# Patient Record
Sex: Female | Born: 1960 | Race: White | Hispanic: No | Marital: Married | Smoking: Current every day smoker
Health system: Southern US, Community
[De-identification: ages and names within clinical notes are randomized; demographics above are authoritative.]

## PROBLEM LIST (undated history)

## (undated) DIAGNOSIS — K219 Gastro-esophageal reflux disease without esophagitis: Secondary | ICD-10-CM

## (undated) DIAGNOSIS — R0789 Other chest pain: Secondary | ICD-10-CM

## (undated) DIAGNOSIS — B159 Hepatitis A without hepatic coma: Secondary | ICD-10-CM

## (undated) DIAGNOSIS — F32A Depression, unspecified: Secondary | ICD-10-CM

## (undated) DIAGNOSIS — F329 Major depressive disorder, single episode, unspecified: Secondary | ICD-10-CM

## (undated) DIAGNOSIS — E7800 Pure hypercholesterolemia, unspecified: Secondary | ICD-10-CM

## (undated) DIAGNOSIS — T7840XA Allergy, unspecified, initial encounter: Secondary | ICD-10-CM

## (undated) DIAGNOSIS — L03019 Cellulitis of unspecified finger: Secondary | ICD-10-CM

## (undated) DIAGNOSIS — F4322 Adjustment disorder with anxiety: Secondary | ICD-10-CM

## (undated) DIAGNOSIS — N951 Menopausal and female climacteric states: Secondary | ICD-10-CM

## (undated) DIAGNOSIS — F17200 Nicotine dependence, unspecified, uncomplicated: Secondary | ICD-10-CM

## (undated) DIAGNOSIS — I809 Phlebitis and thrombophlebitis of unspecified site: Secondary | ICD-10-CM

## (undated) DIAGNOSIS — N912 Amenorrhea, unspecified: Secondary | ICD-10-CM

## (undated) HISTORY — DX: Nicotine dependence, unspecified, uncomplicated: F17.200

## (undated) HISTORY — DX: Major depressive disorder, single episode, unspecified: F32.9

## (undated) HISTORY — PX: ABDOMINAL HYSTERECTOMY: SHX81

## (undated) HISTORY — DX: Cellulitis of unspecified finger: L03.019

## (undated) HISTORY — DX: Adjustment disorder with anxiety: F43.22

## (undated) HISTORY — DX: Amenorrhea, unspecified: N91.2

## (undated) HISTORY — PX: BREAST SURGERY: SHX581

## (undated) HISTORY — DX: Menopausal and female climacteric states: N95.1

## (undated) HISTORY — PX: CERVICAL FUSION: SHX112

## (undated) HISTORY — DX: Hepatitis a without hepatic coma: B15.9

## (undated) HISTORY — DX: Pure hypercholesterolemia, unspecified: E78.00

## (undated) HISTORY — DX: Phlebitis and thrombophlebitis of unspecified site: I80.9

## (undated) HISTORY — DX: Depression, unspecified: F32.A

## (undated) HISTORY — DX: Other chest pain: R07.89

## (undated) HISTORY — DX: Allergy, unspecified, initial encounter: T78.40XA

## (undated) HISTORY — DX: Gastro-esophageal reflux disease without esophagitis: K21.9

## (undated) NOTE — *Deleted (*Deleted)
Patient Care Team: Panosh, Neta Mends, MD as PCP - Denyse Amass, Margretta Ditty, RN as Oncology Nurse Navigator Donnelly Angelica, RN as Oncology Nurse Navigator  DIAGNOSIS: No diagnosis found.  SUMMARY OF ONCOLOGIC HISTORY: Oncology History  Malignant neoplasm of upper-inner quadrant of right breast in female, estrogen receptor positive (HCC)  02/03/2020 Initial Diagnosis   Patient palpated a right beast lump for several months. Mammogram on 01/26/20 showed a 1.2cm mass in the right breast at the 1 o'clock position, a 0.4cm mass at the 12 o'clock position, and no evidence of abnormal axillary adenopathy. 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 She has a history of saline breast implants.   03/09/2020 Surgery   Right lumpectomy (Dr. Johnney Ou) : IDC 1.5 cm, grade 2, margins negative, 0/3 lymph nodes negative Second right lumpectomy: grade 2 IDC 7 mm, margins negative, ER 90%, PR 90%, HER-2 negative, Ki-67 20%   03/10/2020 Oncotype testing   Oncotype DX recurrence score 11, distant recurrence at 9 years: 3%, no chemotherapy benefit   04/23/2020 -  Radiation Therapy   Adjuvant radiation therapy     CHIEF COMPLIANT: Follow-up of right breast cancer on anastrozole  INTERVAL HISTORY: Kari Rowland is a 89 y.o. with above-mentioned history of  right breast cancer who underwent a lumpectomy, radiation, and is currently on antiestrogen therapy with half a tablet of anastrozole daily. She presents to the clinic today for follow-up.    ALLERGIES:  is allergic to procaine, sulfa antibiotics, azithromycin, erythromycin, morphine, other, rosuvastatin, sulfonamide derivatives, tetracycline, and ciprofloxacin.  MEDICATIONS:  Current Outpatient Medications  Medication Sig Dispense Refill  . anastrozole (ARIMIDEX) 1 MG tablet Take 0.5 tablets (0.5 mg total) by mouth daily. 30 tablet 0  . atorvastatin (LIPITOR) 20 MG tablet Take 20  mg by mouth daily.    . cholecalciferol (VITAMIN D) 400 UNITS TABS Take 1,000 Units by mouth daily.      . diazepam (VALIUM) 5 MG tablet 1/2 to 1 tab po tid prn vertigo (Patient not taking: Reported on 04/10/2020) 20 tablet 0  . omeprazole (PRILOSEC) 40 MG capsule Take 1 capsule (40 mg total) by mouth daily. (Patient not taking: Reported on 04/10/2020) 30 capsule 6  . silver sulfADIAZINE (SILVADENE) 1 % cream Apply 1 application topically daily. 50 g 0  . vitamin B-12 (CYANOCOBALAMIN) 1000 MCG tablet 1,000 mcg daily.     No current facility-administered medications for this visit.    PHYSICAL EXAMINATION: ECOG PERFORMANCE STATUS: {CHL ONC ECOG PS:[id number]}  There were no vitals filed for this visit. There were no vitals filed for this visit.  BREAST:*** No palpable masses or nodules in either right or left breasts. No palpable axillary supraclavicular or infraclavicular adenopathy no breast tenderness or nipple discharge. (exam performed in the presence of a chaperone)  LABORATORY DATA:  I have reviewed the data as listed CMP Latest Ref Rng & Units 12/11/2009 08/14/2008 08/30/2007  Glucose 70 - 99 mg/dL 93 83 99  BUN 6 - 23 mg/dL 7 11 17   Creatinine 0.4 - 1.2 mg/dL 0.8 0.9 0.9  Sodium 161 - 145 meq/L 144 144 139  Potassium 3.5 - 5.1 meq/L 4.8 3.7 4.1  Chloride 96 - 112 meq/L 106 108 103  CO2 19 - 32 meq/L 31 31 29   Calcium 8.4 - 10.5 mg/dL 9.6 9.4 9.1  Total Protein 6.0 - 8.3 g/dL 6.6 6.3 0.9(U)  Total Bilirubin 0.3 -  1.2 mg/dL 0.5 0.9 0.7  Alkaline Phos 39 - 117 units/L 57 47 31(L)  AST 0 - 37 units/L 15 22 14   ALT 0 - 35 units/L 14 18 13     Lab Results  Component Value Date   WBC 5.4 08/14/2008   HGB 14.4 08/14/2008   HCT 41.1 08/14/2008   MCV 94.0 08/14/2008   PLT 198 08/14/2008   NEUTROABS 3.4 08/30/2007    ASSESSMENT & PLAN:  No problem-specific Assessment & Plan notes found for this encounter.    No orders of the defined types were placed in this encounter.  The  patient has a good understanding of the overall plan. she agrees with it. she will call with any problems that may develop before the next visit here.  Total time spent: *** mins including face to face time and time spent for planning, charting and coordination of care  Serena Croissant, MD 07/10/2020  I, Kirt Boys Dorshimer, am acting as scribe for Dr. Serena Croissant.  {insert scribe attestation}

---

## 2005-08-11 LAB — HM MAMMOGRAPHY

## 2006-05-11 LAB — CONVERTED CEMR LAB: Pap Smear: NORMAL

## 2007-08-20 ENCOUNTER — Ambulatory Visit: Payer: Self-pay | Admitting: Internal Medicine

## 2007-08-20 DIAGNOSIS — M25673 Stiffness of unspecified ankle, not elsewhere classified: Secondary | ICD-10-CM | POA: Insufficient documentation

## 2007-08-20 DIAGNOSIS — E049 Nontoxic goiter, unspecified: Secondary | ICD-10-CM | POA: Insufficient documentation

## 2007-08-20 DIAGNOSIS — J309 Allergic rhinitis, unspecified: Secondary | ICD-10-CM | POA: Insufficient documentation

## 2007-08-20 DIAGNOSIS — M7061 Trochanteric bursitis, right hip: Secondary | ICD-10-CM

## 2007-08-20 DIAGNOSIS — M25676 Stiffness of unspecified foot, not elsewhere classified: Secondary | ICD-10-CM

## 2007-08-20 DIAGNOSIS — M7062 Trochanteric bursitis, left hip: Secondary | ICD-10-CM

## 2007-08-20 DIAGNOSIS — F17200 Nicotine dependence, unspecified, uncomplicated: Secondary | ICD-10-CM

## 2007-08-20 HISTORY — DX: Nicotine dependence, unspecified, uncomplicated: F17.200

## 2007-08-30 ENCOUNTER — Ambulatory Visit: Payer: Self-pay | Admitting: Internal Medicine

## 2007-08-30 LAB — CONVERTED CEMR LAB
ALT: 13 units/L (ref 0–35)
BUN: 17 mg/dL (ref 6–23)
Bilirubin Urine: NEGATIVE
Bilirubin, Direct: 0.1 mg/dL (ref 0.0–0.3)
CO2: 29 meq/L (ref 19–32)
CRP, High Sensitivity: <1 — ABNORMAL LOW (ref 0.00–5.00)
Calcium: 9.1 mg/dL (ref 8.4–10.5)
Cholesterol: 184 mg/dL (ref 0–200)
Eosinophils Absolute: 0.2 10*3/uL (ref 0.0–0.6)
GFR calc Af Amer: 86 mL/min
GFR calc non Af Amer: 71 mL/min
Glucose, Bld: 99 mg/dL (ref 70–99)
Glucose, Urine, Semiquant: NEGATIVE
HDL: 41 mg/dL (ref >39.0)
Hemoglobin: 13.9 g/dL (ref 12.0–15.0)
Ketones, urine, test strip: NEGATIVE
Lymphocytes Relative: 32.6 % (ref 12.0–46.0)
MCV: 95.2 fL (ref 78.0–100.0)
Monocytes Absolute: 0.3 10*3/uL (ref 0.2–0.7)
Monocytes Relative: 4.4 % (ref 3.0–11.0)
Neutro Abs: 3.4 10*3/uL (ref 1.4–7.7)
Platelets: 240 10*3/uL (ref 150–400)
Potassium: 4.1 meq/L (ref 3.5–5.1)
Rhuematoid fact SerPl-aCnc: <20 intl units/mL — ABNORMAL LOW (ref 0.0–20.0)
Specific Gravity, Urine: 1.02
TSH: 0.72 microintl units/mL (ref 0.35–5.50)
Total Protein: 5.7 g/dL — ABNORMAL LOW (ref 6.0–8.3)
Triglycerides: 140 mg/dL (ref 0–149)
pH: 6

## 2007-09-01 LAB — CONVERTED CEMR LAB: Anti Nuclear Antibody(ANA): NEGATIVE

## 2007-09-13 ENCOUNTER — Ambulatory Visit: Payer: Self-pay | Admitting: Internal Medicine

## 2007-09-13 DIAGNOSIS — E559 Vitamin D deficiency, unspecified: Secondary | ICD-10-CM | POA: Insufficient documentation

## 2007-09-20 ENCOUNTER — Encounter: Payer: Self-pay | Admitting: Internal Medicine

## 2007-10-22 ENCOUNTER — Encounter: Payer: Self-pay | Admitting: Internal Medicine

## 2007-10-27 ENCOUNTER — Telehealth: Payer: Self-pay | Admitting: *Deleted

## 2007-11-08 ENCOUNTER — Ambulatory Visit: Payer: Self-pay | Admitting: Internal Medicine

## 2007-12-07 ENCOUNTER — Ambulatory Visit: Payer: Self-pay | Admitting: Internal Medicine

## 2007-12-07 DIAGNOSIS — J069 Acute upper respiratory infection, unspecified: Secondary | ICD-10-CM | POA: Insufficient documentation

## 2008-08-14 ENCOUNTER — Ambulatory Visit: Payer: Self-pay | Admitting: Internal Medicine

## 2008-08-14 LAB — CONVERTED CEMR LAB
ALT: 18 units/L (ref 0–35)
Albumin: 3.9 g/dL (ref 3.5–5.2)
BUN: 11 mg/dL (ref 6–23)
Bilirubin, Direct: 0.1 mg/dL (ref 0.0–0.3)
CO2: 31 meq/L (ref 19–32)
Calcium: 9.4 mg/dL (ref 8.4–10.5)
Cholesterol: 221 mg/dL (ref 0–200)
GFR calc Af Amer: 86 mL/min
Glucose, Bld: 83 mg/dL (ref 70–99)
Glucose, Urine, Semiquant: NEGATIVE
HDL: 43.3 mg/dL (ref >39.0)
Hemoglobin: 14.4 g/dL (ref 12.0–15.0)
Lymphocytes Relative: 39.6 % (ref 12.0–46.0)
MCHC: 35 g/dL (ref 30.0–36.0)
Monocytes Relative: 6.8 % (ref 3.0–12.0)
Neutrophils Relative %: 49.5 % (ref 43.0–77.0)
Platelets: 198 10*3/uL (ref 150–400)
Protein, U semiquant: NEGATIVE
RDW: 11.6 % (ref 11.5–14.6)
Sodium: 144 meq/L (ref 135–145)
TSH: 0.55 microintl units/mL (ref 0.35–5.50)
Total CHOL/HDL Ratio: 5.1
Total Protein: 6.3 g/dL (ref 6.0–8.3)
Triglycerides: 136 mg/dL (ref 0–149)
Urobilinogen, UA: 1
VLDL: 27 mg/dL (ref 0–40)
pH: 5.5

## 2008-08-21 ENCOUNTER — Ambulatory Visit: Payer: Self-pay | Admitting: Internal Medicine

## 2008-08-21 DIAGNOSIS — R3129 Other microscopic hematuria: Secondary | ICD-10-CM | POA: Insufficient documentation

## 2008-08-21 LAB — CONVERTED CEMR LAB
Nitrite: NEGATIVE
Protein, U semiquant: NEGATIVE
Specific Gravity, Urine: >=1.03
WBC Urine, dipstick: NEGATIVE

## 2008-08-22 ENCOUNTER — Encounter: Payer: Self-pay | Admitting: Internal Medicine

## 2008-08-24 ENCOUNTER — Telehealth: Payer: Self-pay | Admitting: Internal Medicine

## 2008-08-28 ENCOUNTER — Encounter: Payer: Self-pay | Admitting: Internal Medicine

## 2008-09-01 ENCOUNTER — Ambulatory Visit: Payer: Self-pay | Admitting: Internal Medicine

## 2008-09-04 LAB — CONVERTED CEMR LAB
Bacteria, UA: NEGATIVE
Bilirubin Urine: NEGATIVE
Total Protein, Urine: NEGATIVE mg/dL
Urine Glucose: NEGATIVE mg/dL
Urobilinogen, UA: 0.2 (ref 0.0–1.0)

## 2008-09-06 ENCOUNTER — Ambulatory Visit: Payer: Self-pay | Admitting: Cardiology

## 2008-09-07 ENCOUNTER — Telehealth: Payer: Self-pay | Admitting: Internal Medicine

## 2008-09-25 ENCOUNTER — Ambulatory Visit: Payer: Self-pay

## 2008-09-25 ENCOUNTER — Encounter: Payer: Self-pay | Admitting: Cardiology

## 2008-10-11 ENCOUNTER — Encounter: Payer: Self-pay | Admitting: Internal Medicine

## 2008-12-05 ENCOUNTER — Telehealth: Payer: Self-pay | Admitting: *Deleted

## 2009-12-06 ENCOUNTER — Telehealth: Payer: Self-pay | Admitting: *Deleted

## 2009-12-11 ENCOUNTER — Ambulatory Visit: Payer: Self-pay | Admitting: Internal Medicine

## 2009-12-11 DIAGNOSIS — M2550 Pain in unspecified joint: Secondary | ICD-10-CM

## 2009-12-11 DIAGNOSIS — B0089 Other herpesviral infection: Secondary | ICD-10-CM | POA: Insufficient documentation

## 2009-12-11 DIAGNOSIS — N951 Menopausal and female climacteric states: Secondary | ICD-10-CM | POA: Insufficient documentation

## 2009-12-11 HISTORY — DX: Menopausal and female climacteric states: N95.1

## 2009-12-17 ENCOUNTER — Encounter: Payer: Self-pay | Admitting: *Deleted

## 2009-12-17 LAB — CONVERTED CEMR LAB
Albumin: 4.3 g/dL (ref 3.5–5.2)
Alkaline Phosphatase: 57 units/L (ref 39–117)
BUN: 7 mg/dL (ref 6–23)
CO2: 31 meq/L (ref 19–32)
Calcium: 9.6 mg/dL (ref 8.4–10.5)
Creatinine, Ser: 0.8 mg/dL (ref 0.4–1.2)
Free T4: 1 ng/dL (ref 0.6–1.6)
Glucose, Bld: 93 mg/dL (ref 70–99)
Magnesium: 2.3 mg/dL (ref 1.5–2.5)
TSH: 0.63 microintl units/mL (ref 0.35–5.50)
Total Protein: 6.6 g/dL (ref 6.0–8.3)

## 2010-02-07 ENCOUNTER — Encounter (INDEPENDENT_AMBULATORY_CARE_PROVIDER_SITE_OTHER): Payer: Self-pay | Admitting: *Deleted

## 2010-06-25 ENCOUNTER — Telehealth: Payer: Self-pay | Admitting: Family Medicine

## 2010-09-11 NOTE — Progress Notes (Signed)
Summary: refill omeprazole 40mg   Phone Note From Pharmacy   [facility]  [address] Reason for Call: Needs renewal Details for Reason: Omeprazole 40mg  Initial call taken by: Romualdo Bolk, CMA (AAMA),  December 06, 2009 8:13 AM  Follow-up for Phone Call        She has not been seen  in office for  over a year  . She needs to schedule an OV   to be seen .  If she will run out of med  can refill x 1 only   to get her through .  Follow-up by: Madelin Headings MD,  December 06, 2009 11:51 AM  Additional Follow-up for Phone Call Additional follow up Details #1::        Pt aware and appt made. Rx sent electronically. Additional Follow-up by: Romualdo Bolk, CMA Duncan Dull),  December 06, 2009 12:41 PM    Prescriptions: OMEPRAZOLE 40 MG CPDR (OMEPRAZOLE) 1 once daily  #30 x 0   Entered by:   Romualdo Bolk, CMA (AAMA)   Authorized by:   Madelin Headings MD   Signed by:   Romualdo Bolk, CMA (AAMA) on 12/06/2009   Method used:   [facility]        [facility]  [address] [phone] ([facility]ail[facility]       [address]       [place], [place]  [address]       Ph: [phone] or [phone]       Fax: [phone]   RxID:   [id number]

## 2010-09-11 NOTE — Letter (Signed)
Summary: [facility] Referral (unable to schedule) Notification  [facility]  [address] [place], [place] [address]   Phone: [phone]  Fax: [phone]      February 07, 2010 Kari Rowland Jan 01, 1961 MRN: [id number]   [facility] [address] [place], [place]  [address]   Dear Dr. Fabian Sharp:   Thank you for your kind referral of the above patient. We have attempted to schedule the recommended Colonoscopy but have been unable to schedule because:  __ The patient was not available by phone and/or has not returned our calls.  _x_ The patient declined to schedule the procedure at this time.  We appreciate the referral and hope that we will have the opportunity to treat this patient in the future.    Sincerely,   [facility]  Vania Rea. Jarold Motto M.D. Hedwig Morton. Juanda Chance M.D. Venita Lick. Russella Dar M.D. Wilhemina Bonito. Marina Goodell M.D. Barbette Hair. Arlyce Dice M.D. Iva Boop M.D. Cheron Every.D.

## 2010-09-11 NOTE — Assessment & Plan Note (Signed)
Summary: follow up /ssc   Vital Signs:  Patient profile:   50 year old female Menstrual status:  hysterectomy Height:      65.5 inches Weight:      157 pounds BMI:     25.82 Pulse rate:   80 / minute BP sitting:   120 / 80  (right arm) Cuff size:   regular  Vitals Entered By: Romualdo Bolk, CMA (AAMA) (Dec 11, 2009 3:05 PM) CC: Follow-up visit on meds     Menstrual Status hysterectomy Last PAP Result Normal   History of Present Illness: Kari Rowland  comes in for follow up of medical conditins and medication evaluation.  Last visit was Jan 2010 Cards: neg evaluation for atypical chest pain rec modify  risk factors. Rheum   Dr Durenda Age.      said fibromyalgia.  did x ray joints that  look good.  Doesnt think she has FM.  Hip pain and   Is being helped     [employer].  Dx misalignment by  Plain x ray .       Sleep  is      Hot flushes trying otc   ?  emeren.     Much better      sleep with this.   as needed mobic for pain   with help .   GI :   flares off med with red wine.   about 50 % of the time.  taking prilosec   Valtrex   as needed for whitlow.  profession related   stable. THyroid  no recent check   Preventive Screening-Counseling & Management  Alcohol-Tobacco     Alcohol drinks/day: <1     Alcohol type: Wine     Smoking Status: current     Packs/Day: 1/2  Caffeine-Diet-Exercise     Caffeine use/day: 5-6     Does Patient Exercise: no  Current Medications (verified): 1)  Vitamin D 400 Unit  Tabs (Cholecalciferol) .... 1,000 International Units Once Daily 2)  Valtrex 1 Gm  Tabs (Valacyclovir Hcl) .Marland Kitchen.. 1 By Mouth Once Daily 3)  Amberen .... 2 By Mouth Once Daily For 90 Days Then Off For 90 Days and Repeat 4)  Omeprazole 40 Mg Cpdr (Omeprazole) .Marland Kitchen.. 1 Once Daily 5)  Mobic 15 Mg Tabs (Meloxicam) .Marland Kitchen.. 1 By Mouth Once Daily  Allergies (verified): 1)  ! Erythromycin 2)  ! Zithromax 3)  ! Sulfa 4)  ! Tetracycline 5)  ! Morphine  Past  History:  Past medical, surgical, family and social histories (including risk factors) reviewed, and no changes noted (except as noted below).  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 Jens Som Dr. Earlene Plater  Past Surgical History: Reviewed history from 08/21/2008 and no changes required. Hysterectomy-partical for fibroids Breast Augmentation 95 Cervical Fusion-Anterior 2000    Past History:  Care Management: Gynecology: [facility]:  Family History: Reviewed history from 08/21/2008 and no changes required. Family History of CAD Female 1st degree relative <50 MI in late 75's died in 17's   as well as his brothers   Mom  hypothyroidism  Sibs   scleroderma  Family  History Diabetes 1st degree relative father   Social History: Reviewed history from 08/21/2008 and no changes required. 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  Alcohol use-yes red wine intermittently Drug use-no Regular exercise-no  Household of 5 fiance and children gchild  1 pets.  Coke  sometimes sodas.  Caffeine use/day:  5-6  Review of Systems  The patient denies anorexia, fever, weight loss, weight gain, vision loss, decreased hearing, syncope, dyspnea on exertion, peripheral edema, abdominal pain, melena, hematochezia, severe indigestion/heartburn, muscle weakness, unusual weight change, abnormal bleeding, enlarged lymph nodes, and angioedema.    Physical Exam  General:  alert, well-developed, and well-nourished.   Head:  normocephalic and atraumatic.    Eyes:  vision grossly intact, pupils equal, pupils round, and pupils reactive to light.  no redness Ears:  R ear normal, L ear normal, and no external deformities.   Mouth:  Oral mucosa and oropharynx without lesions or exudates.  Teeth in good repair. Neck:  No deformities, masses, or tenderness noted. slightly enlarge thyroid no nodules  Lungs:  Normal respiratory effort, chest expands symmetrically. Lungs are clear to auscultation, no crackles or wheezes.no dullness.   Heart:  normal rate, regular rhythm, and no murmur.   Abdomen:  soft, non-tender, normal bowel sounds, no distention, no hepatomegaly, and no splenomegaly.   Msk:  no joint swelling and no joint warmth.   Pulses:  pulses intact without delay   Extremities:  no clubbing cyanosis or edema  Neurologic:  alert & oriented X3 and gait normal.   Skin:  turgor normal, color normal, no petechiae, and no purpura.   Cervical Nodes:  No lymphadenopathy noted Psych:  Oriented X3, normally interactive, good eye contact, not anxious appearing, and not depressed appearing.     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   Problem # 4:  reflux sx   disc alarm signs and   disc weaning.  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  Problem # 6:  TOBACCO USE (ICD-305.1) should stop.  Complete Medication List: 1)  Vitamin D 400 Unit Tabs (Cholecalciferol) .... 1,000 international units once daily 2)  Valtrex 1 Gm Tabs (Valacyclovir hcl) .Marland Kitchen.. 1 by mouth once daily 3)  Amberen  .... 2 by  mouth once daily for 90 days then off for 90 days and repeat 4)  Omeprazole 40 Mg Cpdr (Omeprazole) .Marland Kitchen.. 1 once daily 5)  Mobic 15 Mg Tabs (Meloxicam) .Marland Kitchen.. 1 by mouth once daily  Other Orders: Gastroenterology Referral (GI)  Patient Instructions: 1)  You will be informed of lab results when available.  2)  have pharmacy notify us about refills  3)  will notify you about colonscopy screening. 4)  Try alternating   prilosec with  zantac or pepcid  to avoid rebound acid. Prescriptions: VALTREX 1 GM  TABS (VALACYCLOVIR HCL) 1 by mouth once daily  #30 x 4   Entered and Authorized by:   Madelin Headings MD   Signed by:   Madelin Headings MD on 12/11/2009   Method used:   [facility]  [place] [address] (retail)       [address]       [place], [place]  [address]       Ph: [phone] or [phone]       Fax: [phone]   RxID:   [id number]

## 2010-09-11 NOTE — Progress Notes (Signed)
Summary: REQUEST TO SWITCH  Phone Note Call from Patient   Caller: Patient Summary of Call: This pt Kari Rowland) requests to become a pt of  Dr Abner Greenspan and transfer to the [facility] location due to the fact that they will be moving to [facility].  The [facility] location will be near their new home and will be more convenient...Marland KitchenMarland KitchenOkay to switch pt?  Please advise and I will call pt at  [phone] to advise the patient.  Thanks,  Bjorn Loser / Scheduling Initial call taken by: Debbra Riding,  June 25, 2010 4:42 PM  Follow-up for Phone Call        ok with me  Follow-up by: Madelin Headings MD,  June 25, 2010 4:50 PM  Additional Follow-up for Phone Call Additional follow up Details #1::        OK with me Additional Follow-up by: Danise Edge MD,  June 25, 2010 7:43 PM    Additional Follow-up for Phone Call Additional follow up Details #2::    LMTCB so pt can be advised and appt can be set up for est with Dr Abner Greenspan.  Follow-up by: Debbra Riding,  June 27, 2010 9:08 AM

## 2010-09-11 NOTE — Letter (Signed)
Summary: Generic Letter  [facility]  [address] [place], [place] [address]   Phone: [phone]  Fax: [phone]    12/17/2009  [facility] [address] [place], [place]  [address]  Dear Ms. Rech,  Tests: (1) BMP (METABOL)   Sodium                    144 mEq/L                   135-145   Potassium                 4.8 mEq/L                   3.5-5.1   Chloride                  106 mEq/L                   96-112   Carbon Dioxide            31 mEq/L                    19-32   Glucose                   93 mg/dL                    62-95   BUN                       7 mg/dL                     2-84   Creatinine                0.8 mg/dL                   1.3-2.4   Calcium                   9.6 mg/dL                   4.0-10.2   GFR                       80.66 mL/min                >60  Tests: (2) Hepatic/Liver Function Panel (HEPATIC)   Total Bilirubin           0.5 mg/dL                   7.2-5.3   Direct Bilirubin          0.1 mg/dL                   6.6-4.4   Alkaline Phosphatase      57 U/L                      39-117   AST                       15 U/L                      0-37   ALT  14 U/L                      0-35   Total Protein             6.6 g/dL                    1.1-9.1   Albumin                   4.3 g/dL                    4.7-8.2  Tests: (3) TSH (TSH)   FastTSH                   0.63 uIU/mL                 0.35-5.50  Tests: (4) T4, Free (FT4R)   Free T4                   1.0 ng/dL                   9.5-6.2  Tests: (5) Magnesium (MG)   Magnesium                 2.3 mg/dL                   1.3-0.8   Your labs are normal. We will follow up with a physical in 1 year or as needed.       Sincerely,   Tor Netters, CMA (AAMA)

## 2010-09-11 NOTE — Consult Note (Signed)
Summary: [facility]  [facility]   Imported By: Maryln Gottron 08/20/2009 15:45:33  _____________________________________________________________________  External Attachment:    Type:   Image     Comment:   External Document

## 2010-09-20 ENCOUNTER — Encounter: Payer: Self-pay | Admitting: Family Medicine

## 2010-09-20 ENCOUNTER — Ambulatory Visit (INDEPENDENT_AMBULATORY_CARE_PROVIDER_SITE_OTHER): Payer: BC Managed Care – PPO | Admitting: Family Medicine

## 2010-09-20 DIAGNOSIS — N912 Amenorrhea, unspecified: Secondary | ICD-10-CM

## 2010-09-20 DIAGNOSIS — E7800 Pure hypercholesterolemia, unspecified: Secondary | ICD-10-CM | POA: Insufficient documentation

## 2010-09-20 DIAGNOSIS — N951 Menopausal and female climacteric states: Secondary | ICD-10-CM

## 2010-09-20 DIAGNOSIS — F17200 Nicotine dependence, unspecified, uncomplicated: Secondary | ICD-10-CM

## 2010-09-20 HISTORY — DX: Amenorrhea, unspecified: N91.2

## 2010-09-20 HISTORY — DX: Pure hypercholesterolemia, unspecified: E78.00

## 2010-09-25 ENCOUNTER — Encounter: Payer: Self-pay | Admitting: Family Medicine

## 2010-09-26 LAB — CONVERTED CEMR LAB
Cholesterol: 262 mg/dL — ABNORMAL HIGH (ref 0–200)
HDL: 53 mg/dL (ref >39)
Triglycerides: 121 mg/dL (ref <150)
VLDL: 24 mg/dL (ref 0–40)

## 2010-10-02 NOTE — Assessment & Plan Note (Signed)
Summary: NOV est care/ hot flashes   Vital Signs:  Patient profile:   50 year old female Menstrual status:  hysterectomy Height:      65.5 inches Weight:      161 pounds BMI:     26.48 O2 Sat:      97 % on Room air Pulse rate:   70 / minute BP sitting:   119 / 77  (left arm) Cuff size:   regular  Vitals Entered By: Payton Spark CMA (September 20, 2010 2:58 PM)  O2 Flow:  Room air CC: New to est.    Primary Care Provider:  Seymour Bars DO  CC:  New to est. .  History of Present Illness: 50 yo WF presents as a NOV, a transfer from [facility].  She is a healthy WF with menopausal symptoms, c/o weight gain of 20 lbs in the past 3 yrs.  She is under more stress at home.  Her mother in law has recently moved in with them.  She is having hot flashes and night sweats that desturb her sleep.  She is s/p TAH for fibroids but has remaining ovaries.  Not interested in HRT.  Has high risk fam hx of heart dz.  She smokes 1 ppd and is overdue for labs and a mammogram.    Current Medications (verified): 1)  Vitamin D 400 Unit  Tabs (Cholecalciferol) .... 1,000 International Units Once Daily 2)  Valtrex 1 Gm  Tabs (Valacyclovir Hcl) .Marland Kitchen.. 1 By Mouth Once Daily 3)  Omeprazole 40 Mg Cpdr (Omeprazole) .Marland Kitchen.. 1 Once Daily 4)  Mobic 15 Mg Tabs (Meloxicam) .Marland Kitchen.. 1 By Mouth Once Daily  Allergies (verified): 1)  ! Erythromycin 2)  ! Zithromax 3)  ! Sulfa 4)  ! Tetracycline 5)  ! 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 Jens Som Dr. Earlene Plater  Past Surgical History: Reviewed history from 08/21/2008 and no changes  required. Hysterectomy-partical for fibroids Breast Augmentation 95 Cervical Fusion-Anterior 2000    Family History: Family History of CAD Female 1st degree relative <50 MI in late 39's died in 36's   as well as his brothers   Mom  hypothyroidism , HTN Sibs   scleroderma  Family History Diabetes 1st degree relative father   Social History: Reviewed history from 08/21/2008 and no changes required. 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  Alcohol use-yes red wine intermittently Drug use-no Regular exercise-no  Household of 5 fiance and children gchild  1 pets.  Coke  sometimes sodas.   Review of Systems       The patient complains of weight gain.  The patient denies anorexia, fever, weight loss, vision loss, decreased hearing, hoarseness, chest pain, syncope, dyspnea on exertion, peripheral edema, prolonged cough, headaches, hemoptysis, abdominal pain, melena, hematochezia, and severe indigestion/heartburn.    Physical Exam  General:  alert, well-developed, well-nourished, well-hydrated  Head:  normocephalic and atraumatic.   Mouth:  good dentition and pharynx pink and moist.   Neck:  no masses and no thyromegaly.   Lungs:  Normal respiratory effort, chest expands  symmetrically. Lungs are clear to auscultation, no crackles or wheezes. Heart:  Normal rate and regular rhythm. S1 and S2 normal without gallop, murmur, click, rub or other extra sounds. Extremities:  no E/C/C Skin:  color normal.   Psych:  good eye contact, not anxious appearing, and not depressed appearing.     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  Counseled on options for hot flashes and nightsweats including non hormonal treatments and discussed adding regular exercise, quitting  smoking, and wt loss.  She will try OTC black cohosh or Estroven first and will f/u with me for a PHYSICAL in 2-3 mos.  Problem # 2:  TOBACCO USE (ICD-305.1) Counseled on smoking cessation but pt is not ready to quit. Plan to get PFTs in the next year to screen for COPD.  Complete Medication List: 1)  Vitamin D 400 Unit Tabs (Cholecalciferol) .... 1,000 international units once daily 2)  Valtrex 1 Gm Tabs (Valacyclovir hcl) .Marland Kitchen.. 1 by mouth once daily 3)  Omeprazole 40 Mg Cpdr (Omeprazole) .Marland Kitchen.. 1 once daily 4)  Mobic 15 Mg Tabs (Meloxicam) .Marland Kitchen.. 1 by mouth once daily  Other Orders: T-Lipid Profile ([id number]) T-TSH [phone]) T-FSH [id number]) T-LH ([phone])  Patient Instructions: 1)  Omeprazole RFd. 2)  Update fasting labs one morning downstairs. 3)  Will call you w/ results. 4)  Work on Low sugar, low carb diet, eating 3 meals/ day.  Aim for 45+ min of exercise 4 days/ wk. 5)  REturn for f/u joint pain/ wt gain/ hot flashes in 4 mos. Prescriptions: OMEPRAZOLE 40 MG CPDR (OMEPRAZOLE) 1 once daily  #30 Each x 6   Entered and Authorized by:   Seymour Bars DO   Signed by:   Seymour Bars DO on 09/20/2010   Method used:   [facility]  [id number]       [address] [place], [place]  [address]       Ph: [phone] or [phone]       Fax: [phone]   RxID:   [id number]    Orders Added: 1)  T-Lipid Profile [phone] 2)  T-TSH [[id number]] 3)  T-FSH [[id number]] 4)  T-LH [83002-23680] 5)  Est. Patient Level III [37628]  Appended Document: NOV est care/ hot flashes Omeprazole 40 mg/ day was filled on 09/20/10 -- pls let pt know this.  Seymour Bars, D.O.  Appended Document: NOV est care/ hot flashes Pt aware of the above

## 2010-12-24 NOTE — Assessment & Plan Note (Signed)
[facility]                            CARDIOLOGY OFFICE NOTE   NOGA, FOGG                      MRN:          [id number]  DATE:09/06/2008                            DOB:          07/03/1961    Kari Rowland is a 50 year old female whom I am asked to evaluation for  chest pain and dyspnea.  She has no prior cardiac history.  She did  state that approximately 2 years ago, she was seen in [place] by  Dr. Gwinda Passe for chest pain.  At that time, she had an echocardiogram.  There apparently was a question of an abnormality but I do not have  those records available.  She also had a stress test.  She recently  moved to [place].  She has had some episodes of chest pain and we  were asked to further evaluate.  Her chest pain is in the substernal  area.  It occasionally radiates to her shoulder and jaw.  She describes  it as a sharp pain.  It occurs with stress only.  It is typically  approximately 10 minutes and resolves spontaneously.  Note, she does not  have exertional chest pain.  There is no associated nausea, vomiting,  shortness breath, or diaphoresis.  The pain is not pleuritic, or  positional, nor is it related to food.  She also has some dyspnea with  more extreme activities.  She attributes this to tobacco use.  There is  no orthopnea, PND, or pedal edema.  Because of the above, we were asked  to further evaluate.   MEDICATIONS:  Soy, black cohosh, and ginseng.   ALLERGIES:  SHE HAS AN ALLERGY TO ERYTHROMYCIN, TETRACYCLINE, SULFA,  ZITHROMAX, MORPHINE, EPINEPHRINE, AND NOVOCAIN.   SOCIAL HISTORY:  She does smoke.  She occasionally consumes alcohol.  She denies any drug use.  She is married.  She lost her first husband  due to carbon monoxide poisoning.  She has 5 children.   FAMILY HISTORY:  Her father had his first myocardial infarction at age  1 and died at age 37 of a myocardial infarction.  She also states that  her  mother has hypertension.   PAST MEDICAL HISTORY:  There is no diabetes mellitus, hypertension, but  there is hyperlipidemia that was recently diagnosed.  She has had a  previous goiter.  She has had a previous breast augmentation as well as  hysterectomy.  She has also had anterior cervical fusion.  She also has  a history of joint pain.  She is presently undergoing menopause.   REVIEW OF SYSTEMS:  She denies any headaches, fevers, or chills.  There  is no productive cough or hemoptysis.  There is no dysphagia,  odynophagia, melena, or hematochezia.  There is no dysuria or hematuria.  There is no rash or seizure activity.  There is no orthopnea, PND, or  pedal edema.  There is no claudication noted, although she does  occasionally has cramping in her calves.  Remaining systems are  negative.   PHYSICAL EXAMINATION:  VITAL SIGNS:  A blood pressure of 118/77 and her  pulse is 68.  She weighs 152 pounds.  GENERAL:  She is well developed and well nourished in no acute distress.  Her skin is warm and dry.  She does not appear to be depressed.  There  is no peripheral clubbing.  BACK:  Normal.  HEENT:  Normal with normal eyelids.  NECK:  Supple with normal upstroke bilaterally.  No bruits noted.  There  is no jugular venous distention and there is no thyromegaly.  CHEST:  Clear to auscultation on expansion.  CARDIOVASCULAR:  Regular rate and rhythm with normal S1 and S2.  There  are no murmurs, rubs, or gallops noted.  ABDOMEN:  Nontender.  Nondistended.  Positive bowel sounds.  No  hepatosplenomegaly.  No mass appreciated.  No abdominal bruit.  She has  2+ femoral pulses bilaterally.  No bruits.  EXTREMITIES:  Show no edema.  I can palpate no cords.  She has 2+  dorsalis pedis pulse bilaterally.  NEUROLOGIC:  Grossly intact.   Her electrocardiogram shows a sinus rhythm at a rate of 65.  The axis is  normal.  There are no ST changes noted.   DIAGNOSES:  1. Chest pain - the patient's  symptoms are atypical.  However, she is      concerned about these and has risk factors.  We will plan to      proceed with a stress echocardiogram.  If it is normal, then we      will continue risk factor modification.  2. History of abnormal echocardiogram - we will obtain records from      [facility] and we will plan to repeat her echocardiogram.  3. Tobacco abuse - we discussed the importance of discontinuing this.  4. Hyperlipidemia - I would recommend a diet at this point and she may      need a statin in the future but I will leave this to Dr. Fabian Sharp.   We will see her back on as needed basis pending the results of her above  studies.     Madolyn Frieze Jens Som, MD, [facility]  Electronically Signed    BSC/MedQ  DD: 09/06/2008  DT: 09/07/2008  Job #: [id number]   cc:   Neta Mends. Fabian Sharp, MD

## 2011-03-21 ENCOUNTER — Ambulatory Visit (INDEPENDENT_AMBULATORY_CARE_PROVIDER_SITE_OTHER): Payer: BC Managed Care – PPO | Admitting: Family Medicine

## 2011-03-21 VITALS — BP 123/73 | HR 97 | Temp 98.4°F | Ht 65.0 in | Wt 159.0 lb

## 2011-03-21 DIAGNOSIS — H8110 Benign paroxysmal vertigo, unspecified ear: Secondary | ICD-10-CM | POA: Insufficient documentation

## 2011-03-21 MED ORDER — DIAZEPAM 5 MG PO TABS: ORAL_TABLET | ORAL | Status: DC

## 2011-03-21 NOTE — Progress Notes (Signed)
  Subjective:    Patient ID: Kari Rowland, female    DOB: Jan 24, 1961, 50 y.o.   MRN: [id number]  HPI  50 yo WF presents for dizzy spells that started 2 days ago.  The room is spinning for a few seconds with moving her head quickly and laying back.  She is not having nausea or vomitting.  No HAs.  No vision problems unless the spinning.  She has to hold onto the bed to not fall.  Has felt like she is going to fall over.  No previous episodes of this.  No heart palpitations, chest pain, SOB or syncope.  No change in meds.  BP 123/73  Pulse 97  Temp 98.4 F (36.9 C)  Ht 5\' 5"  (1.651 m)  Wt 159 lb (72.122 kg)  BMI 26.46 kg/m2  SpO2 97%  Review of Systems     Objective:   Physical Exam  Constitutional: She appears well-developed and well-nourished.  HENT:  Right Ear: External ear normal.  Left Ear: External ear normal.  Mouth/Throat: Oropharynx is clear and moist.  Eyes: Conjunctivae and EOM are normal. Pupils are equal, round, and reactive to light.  Neck: Neck supple. No thyromegaly present.  Cardiovascular: Normal rate, regular rhythm and normal heart sounds.   No murmur heard. Pulmonary/Chest: Effort normal and breath sounds normal.  Lymphadenopathy:    She has no cervical adenopathy.  Neurological:       No tremor + Dix Hallpike on the L with horizontal nystagmus and dizziness x 60 seconds.    Skin: Skin is warm and dry. No pallor.  Psychiatric: She has a normal mood and affect.          Assessment & Plan:

## 2011-03-21 NOTE — Assessment & Plan Note (Signed)
New onset.  + Gilberto Better on the L only.  Given at home exercises Austin Miles h/o given to do at home 2 x a day.  Use Valium to help with symptom relief but cautioned about sedation.  No driving until dizziness is resolved.  Call if not resolved in 7 days.

## 2011-03-21 NOTE — Patient Instructions (Signed)
Benign Positional Vertigo (BPV)  Vertigo is a feeling that you are unsteady or that you or your surroundings are moving. Benign positional vertigo (BPV) is the most common form of vertigo. Benign means it does not have a serious cause. It is an upset in the balance system in your middle ear. This is troublesome but usually not serious. A viral infection or head injury are common causes, but often no cause is found. It is more common as we grow older.  SYMPTOMS  Sudden dizziness happens when you move your head in different directions. Some of the problems that come with this are:   Loss of balance    Throwing up      Blurred vision     Dizziness      Feeling sick to your stomach      DIAGNOSIS  Your caregiver may do some specialized testing to prove what is wrong.  HOME CARE INSTRUCTIONS   Rest and eat a well balanced diet.    Move slowly and do not make sudden body or head movements.    Do not drive a car or do any activities that could hurt you or others.    Lie down and rest. Take precautions to prevent falls.   SEEK IMMEDIATE MEDICAL CARE IF:   You develop headaches which are severe or lasting.    You develop continued vomiting.    A temporary loss or change of vision appears.    You notice temporary numbness on one side of your body.    You are temporarily unable to speak.    Temporary areas of weakness develop.    You have weakness or numbness in the face, arms, or legs.    You notice dizziness or difficulty walking.    You experience slurred speech or difficulty swallowing.   MAKE SURE YOU:     Understand these instructions.    Will watch your condition.    Will get help right away if you are not doing well or get worse.   Document Released: 05/05/2006 Document Re-Released: 11/13/2008  [employer] Patient Information 2011 [employer].

## 2011-04-09 ENCOUNTER — Other Ambulatory Visit: Payer: Self-pay | Admitting: Family Medicine

## 2011-04-09 DIAGNOSIS — M545 Low back pain: Secondary | ICD-10-CM

## 2011-04-11 ENCOUNTER — Ambulatory Visit
Admission: RE | Admit: 2011-04-11 | Discharge: 2011-04-11 | Disposition: A | Discharge status: Home / Self Care | Payer: BC Managed Care – PPO | Source: Ambulatory Visit | Attending: Family Medicine | Admitting: Family Medicine

## 2011-04-11 DIAGNOSIS — M545 Low back pain: Secondary | ICD-10-CM

## 2011-06-11 ENCOUNTER — Ambulatory Visit: Payer: BC Managed Care – PPO | Admitting: Family Medicine

## 2011-06-11 DIAGNOSIS — Z0289 Encounter for other administrative examinations: Secondary | ICD-10-CM

## 2011-06-12 ENCOUNTER — Encounter: Payer: Self-pay | Admitting: Family Medicine

## 2011-06-19 ENCOUNTER — Encounter: Payer: Self-pay | Admitting: Family Medicine

## 2011-06-19 ENCOUNTER — Ambulatory Visit (INDEPENDENT_AMBULATORY_CARE_PROVIDER_SITE_OTHER): Payer: BC Managed Care – PPO | Admitting: Family Medicine

## 2011-06-19 VITALS — BP 124/79 | HR 91 | Wt 164.0 lb

## 2011-06-19 DIAGNOSIS — M7060 Trochanteric bursitis, unspecified hip: Secondary | ICD-10-CM

## 2011-06-19 DIAGNOSIS — M76899 Other specified enthesopathies of unspecified lower limb, excluding foot: Secondary | ICD-10-CM

## 2011-06-19 DIAGNOSIS — M25559 Pain in unspecified hip: Secondary | ICD-10-CM

## 2011-06-19 NOTE — Progress Notes (Signed)
Subjective:    Patient ID: Kari Rowland, female    DOB: 1961/03/31, 50 y.o.   MRN: [id number]  HPI Started menopause 5 year ago.  Says back pain and hip pain that got worse around that time. Says the hip was worse initially. Saw Dr. Terrilee Croak at [facility] who thought her hip pain was form her back. Has tried PT and chiropractic care. Then in July of this year back got worse.  Was on prednisone.  Finally did an MRI and has multiple disc bulging and some tears.  Did PT again and it aggrevated her even more.  Had normal xrays of the left hip.  Both hips feel tender. The Left is worst. Hard to sleep directly on her left hip.  Sits for 8-10 hours a day and that aggrevates her hip. Dr. Chaney Malling retired and saw one of her partners.  Saw Dr. Jorge Mandril this summer who ordered this again. Does get some relief when goes to PT but by later in the say it is worse again. Has weakness and dec sensatoin in the left leg that her PT person has noticed. Using 800mg  IBU bid to tid for months.     Review of Systems     BP 124/79  Pulse 91  Wt 164 lb (74.39 kg)    Allergies  Allergen Reactions  . Azithromycin   . Erythromycin   . Morphine   . Sulfonamide Derivatives   . Tetracycline     Past Medical History  Diagnosis Date  . Hep A w/o coma   . Depression   . Allergy   . Phlebitis   . [place]   . Atypical chest pain   . GERD (gastroesophageal reflux disease)     Past Surgical History  Procedure Date  . Abdominal hysterectomy   . Breast surgery   . Cervical fusion     History   Social History  . Marital Status: Married    Spouse Name: N/A    Number of Children: N/A  . Years of Education: N/A   Occupational History  . Not on file.   Social History Main Topics  . Smoking status: Current Everyday Smoker -- 1.0 packs/day for 15 years    Types: Cigarettes  . Smokeless tobacco: Not on file  . Alcohol Use: Yes     wine intermittently  . Drug Use: No  . Sexually Active:      adjudicator, RN  [employer], single, engaged, household of 5 some caffeine.   Other Topics Concern  . Not on file   Social History Narrative  . No narrative on file    Family History  Problem Relation Age of Onset  . Heart disease      CAD  . Thyroid disease Mother   . Hypertension Mother   . Scleroderma    . Diabetes Father     Current outpatient prescriptions:omeprazole (PRILOSEC) 40 MG capsule, Take 40 mg by mouth daily.  , Disp: , Rfl: ;  valACYclovir (VALTREX) 1000 MG tablet, Take 1,000 mg by mouth daily.  , Disp: , Rfl: ;  cholecalciferol (VITAMIN D) 400 UNITS TABS, Take 1,000 Units by mouth daily.  , Disp: , Rfl: ;  diazepam (VALIUM) 5 MG tablet, 1/2 to 1 tab po tid prn vertigo, Disp: 20 tablet, Rfl: 0 meloxicam (MOBIC) 15 MG tablet, Take 15 mg by mouth daily.  , Disp: , Rfl: ;  rosuvastatin (CRESTOR) 10 MG tablet, Take 10 mg by mouth daily.  , Disp: ,  Rfl:    Objective:   Physical Exam  Constitutional: She is oriented to person, place, and time. She appears well-developed and well-nourished.  HENT:  Head: Normocephalic and atraumatic.  Musculoskeletal:       She is very tender over the left greater trochanter. No bruising or swelling or redness.  Neurological: She is alert and oriented to person, place, and time.  Skin: Skin is warm and dry.  Psychiatric: She has a normal mood and affect. Her behavior is normal.          Assessment & Plan:  Left hip pain-I explained to her that I do think that there are 2 different things going on. First I do think that she probably has some pretty significant degeneration in her back which certainly could be causing her hip pain. I know she doesn't really experience much actual back pain. Actually the pain can be radiated. I also think that she could still just have plain trochanteric bursitis as she is very tender over the greater trochanter. Her pain in her hip never radiates towards the groin crease. I would like to treat the bursitis with  an injection as she has never had this done before. I would like her to call me in about 2 weeks. If she notices some significant improvement and we will see how long the injection helps her. If she does not improve then I would like her to call me and we will refer her to a neurosurgeon for further evaluation and possible surgical treatment. I think at this point she has failed multiple rounds of physical therapy and is working with a [facility] and is followed the instructions from the orthopedist and still has not made significant improvement in her pain control. I'm also concerned because her physical therapist is noticing significant weakness in the left as well.  Left trocanteric bursitis-the area was cleaned and prepped with iodine and alcohol. 1 cc of 40 mg of Kenalog mixed with 9 cc of 1% lidocaine without epinephrine was injected into the left greater trochanter.  Patient is to ice the area when she gets home. She can call for any concerns or problems. For flares she does get relief she will notices within the next couple days.

## 2011-11-10 ENCOUNTER — Encounter: Payer: Self-pay | Admitting: Family Medicine

## 2011-11-10 ENCOUNTER — Ambulatory Visit (INDEPENDENT_AMBULATORY_CARE_PROVIDER_SITE_OTHER): Payer: BC Managed Care – PPO | Admitting: Family Medicine

## 2011-11-10 VITALS — BP 120/75 | HR 87 | Ht 65.0 in | Wt 166.0 lb

## 2011-11-10 DIAGNOSIS — J029 Acute pharyngitis, unspecified: Secondary | ICD-10-CM

## 2011-11-10 NOTE — Progress Notes (Signed)
  Subjective:    Patient ID: Kari Rowland, female    DOB: [birth year], 51 y.o.   MRN: [id number]  HPI ST x 5 days.  No fever. Painful to swallow. n o other URI sxs.  Taking tylenol for pain an salt water gargles.  . NO KNOWN EXPOSURE TO STREP   Review of Systems     Objective:   Physical Exam  Constitutional: She is oriented to person, place, and time. She appears well-developed and well-nourished.  HENT:  Head: Normocephalic and atraumatic.  Right Ear: External ear normal.  Left Ear: External ear normal.  Nose: Nose normal.  Mouth/Throat: Oropharynx is clear and moist.       TMs and canals are clear.   Eyes: Conjunctivae and EOM are normal. Pupils are equal, round, and reactive to light.  Neck: Neck supple. No thyromegaly present.       Large bilat anter cerv LN about 1.5 to 2.0 cm each. Mildly tender.   Cardiovascular: Normal rate, regular rhythm and normal heart sounds.   Pulmonary/Chest: Effort normal and breath sounds normal. She has no wheezes.  Lymphadenopathy:    She has no cervical adenopathy.  Neurological: She is alert and oriented to person, place, and time.  Skin: Skin is warm and dry.  Psychiatric: She has a normal mood and affect.          Assessment & Plan:  Pharyngitis - rapid strep is neg. Will send a culture.  Continue symptomatic care including running a humidifier continue herself on her gargles and Tylenol or Motrin for pain relief.. Call if fever or suddenly gets worse.   Trochanteric bursitis. She plans to come back in for bursa injection for her hip. Says started bothering her about a month ago and says it really helped last time.

## 2011-11-13 ENCOUNTER — Telehealth: Payer: Self-pay | Admitting: *Deleted

## 2011-11-13 MED ORDER — AMOXICILLIN 875 MG PO TABS: 875.0000 mg | ORAL_TABLET | Freq: Two times a day (BID) | ORAL | Status: AC

## 2011-11-13 NOTE — Telephone Encounter (Signed)
Ok will send a rx for amoxicillin to pharm. Thought still probably viral since she is not sure if her sxs actually started 2 days ago or a week ago.

## 2011-11-13 NOTE — Telephone Encounter (Signed)
Pt states that it started a couple days ago. States that she is snoring bad per her husband. She states it has been going on for more than 7 days even though I see she was here on the 1st.

## 2011-11-13 NOTE — Telephone Encounter (Signed)
PT states that she got the call that her strep was negative but she still haas a sore throat, severe headache and tender sinusis and yellow/green drainage. Pt wants to know if we can call something in to pharmacy or should she come back in. Please advise.

## 2011-11-13 NOTE — Telephone Encounter (Signed)
Winded the drainage start? When she was here 3 days ago she did not have any sinus pain pressure drainage or congestion. Her only symptom was a sore throat without fever. Typically a sore throat with a negative strep is viral and can last 7-10 days. Thus if she not significantly better by Monday then call the office and we will call her in something at that point in time.

## 2011-11-14 NOTE — Telephone Encounter (Signed)
LMOM

## 2012-01-12 ENCOUNTER — Ambulatory Visit (INDEPENDENT_AMBULATORY_CARE_PROVIDER_SITE_OTHER): Payer: BC Managed Care – PPO | Admitting: Family Medicine

## 2012-01-12 ENCOUNTER — Encounter: Payer: Self-pay | Admitting: Family Medicine

## 2012-01-12 VITALS — BP 124/76 | HR 83 | Ht 65.0 in | Wt 169.0 lb

## 2012-01-12 DIAGNOSIS — M5430 Sciatica, unspecified side: Secondary | ICD-10-CM

## 2012-01-12 DIAGNOSIS — M76899 Other specified enthesopathies of unspecified lower limb, excluding foot: Secondary | ICD-10-CM

## 2012-01-12 DIAGNOSIS — M549 Dorsalgia, unspecified: Secondary | ICD-10-CM

## 2012-01-12 DIAGNOSIS — M7060 Trochanteric bursitis, unspecified hip: Secondary | ICD-10-CM

## 2012-01-12 MED ORDER — DIAZEPAM 5 MG PO TABS: ORAL_TABLET | ORAL | Status: AC

## 2012-01-12 MED ORDER — PREDNISONE 20 MG PO TABS: ORAL_TABLET | ORAL | Status: DC

## 2012-01-12 MED ORDER — MELOXICAM 15 MG PO TABS: 15.0000 mg | ORAL_TABLET | Freq: Every day | ORAL | Status: DC

## 2012-01-12 MED ORDER — KETOROLAC TROMETHAMINE 60 MG/2ML IM SOLN
60.0000 mg | Freq: Once | INTRAMUSCULAR | Status: AC
  Administered 2012-01-12: 60 mg via INTRAMUSCULAR

## 2012-01-12 MED ORDER — OMEPRAZOLE 40 MG PO CPDR: 40.0000 mg | DELAYED_RELEASE_CAPSULE | Freq: Every day | ORAL | Status: AC

## 2012-01-12 NOTE — Progress Notes (Signed)
  Subjective:    Patient ID: Kari Rowland, female    DOB: October 01, 1960, 50 y.o.   MRN: [id number]  HPI Back Pain- last week was bending over to empty dishwasher and felt a pop. Started getting pain, spasms, and has used heat, ice and NSAIDs.  Says it is very twingy.  Next week has a 20 hour car ride to [place].  Pain radiating into the left buttock and left leg.  She knows she is about 30 lbs overweight.  Using aleve and IBU (alternating).  Worse with prolonged sitting and standing.    Hip has been bothering her for the last couple of months and then last week hasn't been able to sleep on that side.   Review of Systems     Objective:   Physical Exam  Constitutional: She is oriented to person, place, and time. She appears well-developed and well-nourished.  HENT:  Head: Normocephalic and atraumatic.  Musculoskeletal:       + straight leg raise bilaterally. Dec flexion to about 45 degree, dec extension, dec side bending on the right. Dec rotation to the right. Hip, knee and ankle strength 5/5 bilat.  Patellar and achilles reflexes 2+ bilat. Tender over the left greater trochanter.  Tender over the right lumbar spine. Nontender on the left.   Neurological: She is alert and oriented to person, place, and time.  Skin: Skin is warm and dry.  Psychiatric: She has a normal mood and affect. Her behavior is normal.          Assessment & Plan:  Low Back Pain - given Toradol injection today. We'll give her 10 days of prednisone. This is work over in the past. Also she has taken Mobic in the past which she says works much better than ibuprofen or Aleve, which has been currently using. Then a prescription to her pharmacy. Make sure taking her PPI while using this medication to avoid any GI irritation or ulcer. Also make sure she gets out and stretch frequently since she has a 20 hour car ride next week. When she gets back if she is interested we can certainly set her up for physical therapy. She says  ultrasound and massage therapy had helped her in the past.  Left greater trochanteric bursitis- if she is still not able to sleep on the hip when she gets back from being out of town then consider steroid injection into the hip. For now we are going to give her oral steroids because it will hopefully help her low back. Continue ice and massage.

## 2012-01-12 NOTE — Patient Instructions (Addendum)
Low Back Sprain  with Rehab   A sprain is an injury in which a ligament is torn. The ligaments of the lower back are vulnerable to sprains. However, they are strong and require great force to be injured. These ligaments are important for stabilizing the spinal column. Sprains are classified into three categories. Grade 1 sprains cause pain, but the tendon is not lengthened. Grade 2 sprains include a lengthened ligament, due to the ligament being stretched or partially ruptured. With grade 2 sprains there is still function, although the function may be decreased. Grade 3 sprains involve a complete tear of the tendon or muscle, and function is usually impaired.  SYMPTOMS   · Severe pain in the lower back.  · Sometimes, a feeling of a "pop," "snap," or tear, at the time of injury.  · Tenderness and sometimes swelling at the injury site.  · Uncommonly, bruising (contusion) within 48 hours of injury.  · Muscle spasms in the back.  CAUSES   Low back sprains occur when a force is placed on the ligaments that is greater than they can handle. Common causes of injury include:  · Performing a stressful act while off-balance.  · Repetitive stressful activities that involve movement of the lower back.  · Direct hit (trauma) to the lower back.  RISK INCREASES WITH:  · Contact sports (football, wrestling).  · Collisions (major skiing accidents).  · Sports that require throwing or lifting (baseball, weightlifting).  · Sports involving twisting of the spine (gymnastics, diving, tennis, golf).  · Poor strength and flexibility.  · Inadequate protection.  · Previous back injury or surgery (especially fusion).  PREVENTION  · Wear properly fitted and padded protective equipment.  · Warm up and stretch properly before activity.  · Allow for adequate recovery between workouts.  · Maintain physical fitness:  · Strength, flexibility, and endurance.  · Cardiovascular fitness.  · Maintain a healthy body weight.  PROGNOSIS   If treated  properly, low back sprains usually heal with non-surgical treatment. The length of time for healing depends on the severity of the injury.   RELATED COMPLICATIONS   · Recurring symptoms, resulting in a chronic problem.  · Chronic inflammation and pain in the low back.  · Delayed healing or resolution of symptoms, especially if activity is resumed too soon.  · Prolonged impairment.  · Unstable or arthritic joints of the low back.  TREATMENT   Treatment first involves the use of ice and medicine, to reduce pain and inflammation. The use of strengthening and stretching exercises may help reduce pain with activity. These exercises may be performed at home or with a therapist. Severe injuries may require referral to a therapist for further evaluation and treatment, such as ultrasound. Your caregiver may advise that you wear a back brace or corset, to help reduce pain and discomfort. Often, prolonged bed rest results in greater harm then benefit. Corticosteroid injections may be recommended. However, these should be reserved for the most serious cases. It is important to avoid using your back when lifting objects. At night, sleep on your back on a firm mattress, with a pillow placed under your knees. If non-surgical treatment is unsuccessful, surgery may be needed.   MEDICATION   · If pain medicine is needed, nonsteroidal anti-inflammatory medicines (aspirin and ibuprofen), or other minor pain relievers (acetaminophen), are often advised.  · Do not take pain medicine for 7 days before surgery.  · Prescription pain relievers may be given, if your caregiver   Use only as directed and only as much as you need.   Ointments applied to the skin may be helpful.   Corticosteroid injections may be given by your caregiver. These injections should be reserved for the most serious cases, because they may only be given a certain number of times.  HEAT AND COLD  Cold  treatment (icing) should be applied for 10 to 15 minutes every 2 to 3 hours for inflammation and pain, and immediately after activity that aggravates your symptoms. Use ice packs or an ice massage.   Heat treatment may be used before performing stretching and strengthening activities prescribed by your caregiver, physical therapist, or athletic trainer. Use a heat pack or a warm water soak.  SEEK MEDICAL CARE IF:    Symptoms get worse or do not improve in 2 to 4 weeks, despite treatment.   You develop numbness or weakness in either leg.   You lose bowel or bladder function.   Any of the following occur after surgery: fever, increased pain, swelling, redness, drainage of fluids, or bleeding in the affected area.   New, unexplained symptoms develop. (Drugs used in treatment may produce side effects.)  EXERCISES   RANGE OF MOTION (ROM) AND STRETCHING EXERCISES - Low Back Sprain Most people with lower back pain will find that their symptoms get worse with excessive bending forward (flexion) or arching at the lower back (extension). The exercises that will help resolve your symptoms will focus on the opposite motion.   Your physician, physical therapist or athletic trainer will help you determine which exercises will be most helpful to resolve your lower back pain. Do not complete any exercises without first consulting with your caregiver. Discontinue any exercises which make your symptoms worse, until you speak to your caregiver. If you have pain, numbness or tingling which travels down into your buttocks, leg or foot, the goal of the therapy is for these symptoms to move closer to your back and eventually resolve. Sometimes, these leg symptoms will get better, but your lower back pain may worsen. This is often an indication of progress in your rehabilitation. Be very alert to any changes in your symptoms and the activities in which you participated in the 24 hours prior to the change. Sharing this  information with your caregiver will allow him or her to most efficiently treat your condition. These exercises may help you when beginning to rehabilitate your injury. Your symptoms may resolve with or without further involvement from your physician, physical therapist or athletic trainer. While completing these exercises, remember:    Restoring tissue flexibility helps normal motion to return to the joints. This allows healthier, less painful movement and activity.   An effective stretch should be held for at least 30 seconds.   A stretch should never be painful. You should only feel a gentle lengthening or release in the stretched tissue.  FLEXION RANGE OF MOTION AND STRETCHING EXERCISES: STRETCH - Flexion, Single Knee to Chest   Lie on a firm bed or floor with both legs extended in front of you.   Keeping one leg in contact with the floor, bring your opposite knee to your chest. Hold your leg in place by either grabbing behind your thigh or at your knee.   Pull until you feel a gentle stretch in your low back. Hold __________ seconds.   Slowly release your grasp and repeat the exercise with the opposite side.  Repeat __________ times. Complete this exercise __________ times per day.  STRETCH - Flexion, Double Knee to Chest  Lie on a firm bed or floor with both legs extended in front of you.   Keeping one leg in contact with the floor, bring your opposite knee to your chest.   Tense your stomach muscles to support your back and then lift your other knee to your chest. Hold your legs in place by either grabbing behind your thighs or at your knees.   Pull both knees toward your chest until you feel a gentle stretch in your low back. Hold __________ seconds.   Tense your stomach muscles and slowly return one leg at a time to the floor.  Repeat __________ times. Complete this exercise __________ times per day.   STRETCH - Low Trunk Rotation  Lie on a firm bed or floor. Keeping your  legs in front of you, bend your knees so they are both pointed toward the ceiling and your feet are flat on the floor.   Extend your arms out to the side. This will stabilize your upper body by keeping your shoulders in contact with the floor.   Gently and slowly drop both knees together to one side until you feel a gentle stretch in your low back. Hold for __________ seconds.   Tense your stomach muscles to support your lower back as you bring your knees back to the starting position. Repeat the exercise to the other side.  Repeat __________ times. Complete this exercise __________ times per day   EXTENSION RANGE OF MOTION AND FLEXIBILITY EXERCISES: STRETCH - Extension, Prone on Elbows   Lie on your stomach on the floor, a bed will be too soft. Place your palms about shoulder width apart and at the height of your head.   Place your elbows under your shoulders. If this is too painful, stack pillows under your chest.   Allow your body to relax so that your hips drop lower and make contact more completely with the floor.   Hold this position for __________ seconds.   Slowly return to lying flat on the floor.  Repeat __________ times. Complete this exercise __________ times per day.   RANGE OF MOTION - Extension, Prone Press Ups  Lie on your stomach on the floor, a bed will be too soft. Place your palms about shoulder width apart and at the height of your head.   Keeping your back as relaxed as possible, slowly straighten your elbows while keeping your hips on the floor. You may adjust the placement of your hands to maximize your comfort. As you gain motion, your hands will come more underneath your shoulders.   Hold this position __________ seconds.   Slowly return to lying flat on the floor.  Repeat __________ times. Complete this exercise __________ times per day.   RANGE OF MOTION- Quadruped, Neutral Spine   Assume a hands and knees position on a firm surface. Keep your hands under  your shoulders and your knees under your hips. You may place padding under your knees for comfort.   Drop your head and point your tailbone toward the ground below you. This will round out your lower back like an angry cat. Hold this position for __________ seconds.   Slowly lift your head and release your tail bone so that your back sags into a large arch, like an old horse.   Hold this position for __________ seconds.   Repeat this until you feel limber in your low back.   Now, find your "sweet spot."  This will be the most comfortable position somewhere between the two previous positions. This is your neutral spine. Once you have found this position, tense your stomach muscles to support your low back.   Hold this position for __________ seconds.  Repeat __________ times. Complete this exercise __________ times per day.   STRENGTHENING EXERCISES - Low Back Sprain These exercises may help you when beginning to rehabilitate your injury. These exercises should be done near your "sweet spot." This is the neutral, low-back arch, somewhere between fully rounded and fully arched, that is your least painful position. When performed in this safe range of motion, these exercises can be used for people who have either a flexion or extension based injury. These exercises may resolve your symptoms with or without further involvement from your physician, physical therapist or athletic trainer. While completing these exercises, remember:    Muscles can gain both the endurance and the strength needed for everyday activities through controlled exercises.   Complete these exercises as instructed by your physician, physical therapist or athletic trainer. Increase the resistance and repetitions only as guided.   You may experience muscle soreness or fatigue, but the pain or discomfort you are trying to eliminate should never worsen during these exercises. If this pain does worsen, stop and make certain you are  following the directions exactly. If the pain is still present after adjustments, discontinue the exercise until you can discuss the trouble with your caregiver.  STRENGTHENING - Deep Abdominals, Pelvic Tilt   Lie on a firm bed or floor. Keeping your legs in front of you, bend your knees so they are both pointed toward the ceiling and your feet are flat on the floor.   Tense your lower abdominal muscles to press your low back into the floor. This motion will rotate your pelvis so that your tail bone is scooping upwards rather than pointing at your feet or into the floor.  With a gentle tension and even breathing, hold this position for __________ seconds. Repeat __________ times. Complete this exercise __________ times per day.   STRENGTHENING - Abdominals, Crunches   Lie on a firm bed or floor. Keeping your legs in front of you, bend your knees so they are both pointed toward the ceiling and your feet are flat on the floor. Cross your arms over your chest.   Slightly tip your chin down without bending your neck.   Tense your abdominals and slowly lift your trunk high enough to just clear your shoulder blades. Lifting higher can put excessive stress on the lower back and does not further strengthen your abdominal muscles.   Control your return to the starting position.  Repeat __________ times. Complete this exercise __________ times per day.   STRENGTHENING - Quadruped, Opposite UE/LE Lift   Assume a hands and knees position on a firm surface. Keep your hands under your shoulders and your knees under your hips. You may place padding under your knees for comfort.   Find your neutral spine and gently tense your abdominal muscles so that you can maintain this position. Your shoulders and hips should form a rectangle that is parallel with the floor and is not twisted.   Keeping your trunk steady, lift your right hand no higher than your shoulder and then your left leg no higher than your hip.  Make sure you are not holding your breath. Hold this position for __________ seconds.   Continuing to keep your abdominal muscles tense and your back steady,  slowly return to your starting position. Repeat with the opposite arm and leg.  Repeat __________ times. Complete this exercise __________ times per day.   STRENGTHENING - Abdominals and Quadriceps, Straight Leg Raise   Lie on a firm bed or floor with both legs extended in front of you.   Keeping one leg in contact with the floor, bend the other knee so that your foot can rest flat on the floor.   Find your neutral spine, and tense your abdominal muscles to maintain your spinal position throughout the exercise.   Slowly lift your straight leg off the floor about 6 inches for a count of 15, making sure to not hold your breath.   Still keeping your neutral spine, slowly lower your leg all the way to the floor.  Repeat this exercise with each leg __________ times. Complete this exercise __________ times per day. POSTURE AND BODY MECHANICS CONSIDERATIONS - Low Back Sprain Keeping correct posture when sitting, standing or completing your activities will reduce the stress put on different body tissues, allowing injured tissues a chance to heal and limiting painful experiences. The following are general guidelines for improved posture. Your physician or physical therapist will provide you with any instructions specific to your needs. While reading these guidelines, remember:  The exercises prescribed by your provider will help you have the flexibility and strength to maintain correct postures.   The correct posture provides the best environment for your joints to work. All of your joints have less wear and tear when properly supported by a spine with good posture. This means you will experience a healthier, less painful body.   Correct posture must be practiced with all of your activities, especially prolonged sitting and standing. Correct  posture is as important when doing repetitive low-stress activities (typing) as it is when doing a single heavy-load activity (lifting).  RESTING POSITIONS Consider which positions are most painful for you when choosing a resting position. If you have pain with flexion-based activities (sitting, bending, stooping, squatting), choose a position that allows you to rest in a less flexed posture. You would want to avoid curling into a fetal position on your side. If your pain worsens with extension-based activities (prolonged standing, working overhead), avoid resting in an extended position such as sleeping on your stomach. Most people will find more comfort when they rest with their spine in a more neutral position, neither too rounded nor too arched. Lying on a non-sagging bed on your side with a pillow between your knees, or on your back with a pillow under your knees will often provide some relief. Keep in mind, being in any one position for a prolonged period of time, no matter how correct your posture, can still lead to stiffness. PROPER SITTING POSTURE In order to minimize stress and discomfort on your spine, you must sit with correct posture. Sitting with good posture should be effortless for a healthy body. Returning to good posture is a gradual process. Many people can work toward this most comfortably by using various supports until they have the flexibility and strength to maintain this posture on their own. When sitting with proper posture, your ears will fall over your shoulders and your shoulders will fall over your hips. You should use the back of the chair to support your upper back. Your lower back will be in a neutral position, just slightly arched. You may place a small pillow or folded towel at the base of your lower back for  support.   When working at a desk, create an environment that supports good, upright posture. Without extra support, muscles tire, which leads to excessive strain on  joints and other tissues. Keep these recommendations in mind: CHAIR:  A chair should be able to slide under your desk when your back makes contact with the back of the chair. This allows you to work closely.   The chair's height should allow your eyes to be level with the upper part of your monitor and your hands to be slightly lower than your elbows.  BODY POSITION  Your feet should make contact with the floor. If this is not possible, use a foot rest.   Keep your ears over your shoulders. This will reduce stress on your neck and low back.  INCORRECT SITTING POSTURES  If you are feeling tired and unable to assume a healthy sitting posture, do not slouch or slump. This puts excessive strain on your back tissues, causing more damage and pain. Healthier options include:  Using more support, like a lumbar pillow.   Switching tasks to something that requires you to be upright or walking.   Talking a brief walk.   Lying down to rest in a neutral-spine position.  PROLONGED STANDING WHILE SLIGHTLY LEANING FORWARD  When completing a task that requires you to lean forward while standing in one place for a long time, place either foot up on a stationary 2-4 inch high object to help maintain the best posture. When both feet are on the ground, the lower back tends to lose its slight inward curve. If this curve flattens (or becomes too large), then the back and your other joints will experience too much stress, tire more quickly, and can cause pain. CORRECT STANDING POSTURES Proper standing posture should be assumed with all daily activities, even if they only take a few moments, like when brushing your teeth. As in sitting, your ears should fall over your shoulders and your shoulders should fall over your hips. You should keep a slight tension in your abdominal muscles to brace your spine. Your tailbone should point down to the ground, not behind your body, resulting in an over-extended swayback posture.    INCORRECT STANDING POSTURES  Common incorrect standing postures include a forward head, locked knees and/or an excessive swayback. WALKING Walk with an upright posture. Your ears, shoulders and hips should all line-up. PROLONGED ACTIVITY IN A FLEXED POSITION When completing a task that requires you to bend forward at your waist or lean over a low surface, try to find a way to stabilize 3 out of 4 of your limbs. You can place a hand or elbow on your thigh or rest a knee on the surface you are reaching across. This will provide you more stability, so that your muscles do not tire as quickly. By keeping your knees relaxed, or slightly bent, you will also reduce stress across your lower back. CORRECT LIFTING TECHNIQUES DO :  Assume a wide stance. This will provide you more stability and the opportunity to get as close as possible to the object which you are lifting.   Tense your abdominals to brace your spine. Bend at the knees and hips. Keeping your back locked in a neutral-spine position, lift using your leg muscles. Lift with your legs, keeping your back straight.   Test the weight of unknown objects before attempting to lift them.   Try to keep your elbows locked down at your sides in order get the best  strength from your shoulders when carrying an object.   Always ask for help when lifting heavy or awkward objects.  INCORRECT LIFTING TECHNIQUES DO NOT:   Lock your knees when lifting, even if it is a small object.   Bend and twist. Pivot at your feet or move your feet when needing to change directions.   Assume that you can safely pick up even a paperclip without proper posture.  Document Released: 07/28/2005 Document Revised: 07/17/2011 Document Reviewed: 11/09/2008 [facility] Patient Information 2012 [place], [place].Trochanteric Bursitis You have hip pain due to trochanteric bursitis. Bursitis means that the sack near the outside of the hip is filled with fluid and inflamed. This sack  is made up of protective soft tissue. The pain from trochanteric bursitis can be severe and keep you from sleep. It can radiate to the buttocks or down the outside of the thigh to the knee. The pain is almost always worse when rising from the seated or lying position and with walking. Pain can improve after you take a few steps. It happens more often in people with hip joint and lumbar spine problems, such as arthritis or previous surgery. Very rarely the trochanteric bursa can become infected, and antibiotics and/or surgery may be needed. Treatment often includes an injection of local anesthetic mixed with cortisone medicine. This medicine is injected into the area where it is most tender over the hip. Repeat injections may be necessary if the response to treatment is slow. You can apply ice packs over the tender area for 30 minutes every 2 hours for the next few days. Anti-inflammatory and/or narcotic pain medicine may also be helpful. Limit your activity for the next few days if the pain continues. See your caregiver in 5-10 days if you are not greatly improved.   SEEK IMMEDIATE MEDICAL CARE IF:  You develop severe pain, fever, or increased redness.   You have pain that radiates below the knee.  EXERCISES STRETCHING EXERCISES - Trochantic Bursitis  These exercises may help you when beginning to rehabilitate your injury. Your symptoms may resolve with or without further involvement from your physician, physical therapist or athletic trainer. While completing these exercises, remember:    Restoring tissue flexibility helps normal motion to return to the joints. This allows healthier, less painful movement and activity.   An effective stretch should be held for at least 30 seconds.   A stretch should never be painful. You should only feel a gentle lengthening or release in the stretched tissue.  STRETCH - Iliotibial Band  On the floor or bed, lie on your side so your injured leg is on top. Bend  your knee and grab your ankle.   Slowly bring your knee back so that your thigh is in line with your trunk. Keep your heel at your buttocks and gently arch your back so your head, shoulders and hips line up.   Slowly lower your leg so that your knee approaches the floor/bed until you feel a gentle stretch on the outside of your thigh. If you do not feel a stretch and your knee will not fall farther, place the heel of your opposite foot on top of your knee and pull your thigh down farther.   Hold this stretch for __________ seconds.   Repeat __________ times. Complete this exercise __________ times per day.  STRETCH - Hamstrings, Supine   Lie on your back. Loop a belt or towel over the ball of your foot as shown.   Straighten your  knee and slowly pull on the belt to raise your injured leg. Do not allow the knee to bend. Keep your opposite leg flat on the floor.   Raise the leg until you feel a gentle stretch behind your knee or thigh. Hold this position for __________ seconds.   Repeat __________ times. Complete this stretch __________ times per day.  STRETCH - Quadriceps, Prone   Lie on your stomach on a firm surface, such as a bed or padded floor.   Bend your knee and grasp your ankle. If you are unable to reach, your ankle or pant leg, use a belt around your foot to lengthen your reach.   Gently pull your heel toward your buttocks. Your knee should not slide out to the side. You should feel a stretch in the front of your thigh and/or knee.   Hold this position for __________ seconds.   Repeat __________ times. Complete this stretch __________ times per day.  STRETCHING - Hip Flexors, Lunge Half kneel with your knee on the floor and your opposite knee bent and directly over your ankle.  Keep good posture with your head over your shoulders. Tighten your buttocks to point your tailbone downward; this will prevent your back from arching too much.   You should feel a gentle stretch in  the front of your thigh and/or hip. If you do not feel any resistance, slightly slide your opposite foot forward and then slowly lunge forward so your knee once again lines up over your ankle. Be sure your tailbone remains pointed downward.   Hold this stretch for __________ seconds.   Repeat __________ times. Complete this stretch __________ times per day.  STRETCH - Adductors, Lunge  While standing, spread your legs   Lean away from your injured leg by bending your opposite knee. You may rest your hands on your thigh for balance.   You should feel a stretch in your inner thigh. Hold for __________ seconds.   Repeat __________ times. Complete this exercise __________ times per day.  Document Released: 09/04/2004 Document Revised: 07/17/2011 Document Reviewed: 11/09/2008 [facility] Patient Information 2012 [place], [place].

## 2012-04-16 ENCOUNTER — Ambulatory Visit (INDEPENDENT_AMBULATORY_CARE_PROVIDER_SITE_OTHER): Payer: BC Managed Care – PPO | Admitting: Licensed Clinical Social Worker

## 2012-04-16 DIAGNOSIS — F4322 Adjustment disorder with anxiety: Secondary | ICD-10-CM

## 2012-04-16 NOTE — Progress Notes (Signed)
Presenting Problem Chief Complaint: Kari Rowland comes in today for she is frustrated with some problems she has with her husband.  They are best friends and they hardly ever argue but they are at an impasse with some issues.  He has been used to living a pretty easy going life while single - he had an open door policy where his friends were always welcome - he only had visitation with his children so he was the fun dad while she raised 5 children on her own and was a strict parent.  She finds herself working all day to have company that she did not expect.  He does not get her need to plan.  She admits that she has trouble relaxing.  Her job is very demanding right now - she has to work overtime and she does not like the situation at the moment.  Her children were obviously raised well for they are polite and respectful while his children are disrespectful to her  She kept in her feelings for a long time and then she did blow; after his son brought friends in and they disrespected her rules after she had told them all what she expected  She ended up kicking them all out which did not make herself popular.  Her husband does not see what she sees.  Her husband's mother is paranoid schizophrenic and she has accused her of taking her panties.  So she is tired of all of the threats and accusations  She stated that his father is a saint.  Because of his financial problems she had to get the mortgage in her name. In the session, she gave me more information about her current situation and her husband's family.  So need to get more of her history on next session.   What are the main stressors in your life right now, how long? Anxiety   2, Mood Swings  1 and Irritability   3   Previous mental health services Have you ever been treated for a mental health problem, when, where, by whom? No     Are you currently seeing a therapist or counselor, counselor's name? No   Have you ever had a mental health hospitalization, how  many times, length of stay? No   Have you ever been treated with medication, name, reason, response? No   Have you ever had suicidal thoughts or attempted suicide, when, how? No   Risk factors for Suicide Demographic factors:  Caucasian Current mental status: NA Loss factors: NA Historical factors: NA Risk Reduction factors: Responsible for children under 57 years of age, good support system, family connections Clinical factors:  Dysthymia Cognitive features that contribute to risk: NA   SUICIDE RISK:  Minimal: No identifiable suicidal ideation.  Patients presenting with no risk factors but with morbid ruminations; may be classified as minimal risk based on the severity of the depressive symptoms  Medical history Medical treatment and/or problems, explain: Yes Whitlow, GERD, Hepatitis A, Allergies, Atypical chest pain Do you have any issues with chronic pain?  No  Name of primary care physician/last physical exam: Cecile Hearing  Allergies: Yes Medication, reactions? See allergy section   Current medications: See medication section Prescribed by: Cecile Hearing Is there any history of mental health problems or substance abuse in your family, whom? ?  Has anyone in your family been hospitalized, who, where, length of stay? No   Social/family history Have you been married, how many times?  Twice  Do you have  children?  Yes  How many pregnancies have you had?  5  Who lives in your current household? Patient and husband  Military history: No   Religious/spiritual involvement:  What religion/faith base are you? Christian  Family of origin (childhood history)  Where were you born? ? Where did you grow up? ? How many different homes have you lived? ? Describe the atmosphere of the household where you grew up: ? Do you have siblings, step/half siblings, list names, relation, sex, age? Yes   Are your parents separated/divorced, when and why? No   Are your parents alive? Yes    Social supports (personal and professional): Husband and children  Education How many grades have you completed? ? Did you have any problems in school, what type? No  Medications prescribed for these problems? No   Employment (financial issues)  She has not had financial problems but her husband has - out of work and Nurse, learning disability history  None   Trauma/Abuse history: Have you ever been exposed to any form of abuse, what type? No   Have you ever been exposed to something traumatic, describe? No   Substance use Do you use Caffeine? Yes Type, frequency? coffee  Do you use Nicotine? Yes  Type, frequency, ppd? One pack per day for 15 years    Do you use Alcohol? Yes Type, frequency? Wine infrequently  How old were you went you first tasted alcohol? ? Was this accepted by your family? ?  When was your last drink, type, how much? ?  Have you ever used illicit drugs or taken more than prescribed, type, frequency, date of last usage? No   Mental Status: General Appearance Kari Rowland:  Neat Eye Contact:  Good Motor Behavior:  Normal Speech:  Normal Level of Consciousness:  Alert Mood:  Anxious Affect:  Appropriate Anxiety Level:  Minimal Thought Process:  Coherent and Relevant Thought Content:  WNL Perception:  Normal Judgment:  Good Insight:  Present Cognition:  Concentration Yes  Diagnosis AXIS I Adjustment Disorder with Anxiety  AXIS II Deferred  AXIS III Past Medical History  Diagnosis Date  . Hep A w/o coma   . Depression   . Allergy   . Phlebitis   . Whitlow   . Atypical chest pain   . GERD (gastroesophageal reflux disease)     AXIS IV problems with primary support group  AXIS V 61-70 mild symptoms   Plan: Meet weekly for now, develop rapport and treatment plan  _________________________________________          Merlene Morse MSW, LCSW / Date  04/16/12

## 2012-04-22 ENCOUNTER — Ambulatory Visit (HOSPITAL_COMMUNITY): Payer: BC Managed Care – PPO | Admitting: Licensed Clinical Social Worker

## 2012-04-22 ENCOUNTER — Encounter (HOSPITAL_COMMUNITY): Payer: Self-pay | Admitting: Licensed Clinical Social Worker

## 2012-04-22 DIAGNOSIS — F4322 Adjustment disorder with anxiety: Secondary | ICD-10-CM

## 2012-04-22 HISTORY — DX: Adjustment disorder with anxiety: F43.22

## 2012-04-22 NOTE — Progress Notes (Deleted)
Presenting Problem Chief Complaint: ***  What are the main stressors in your life right now, how long? {CHL AMB BH LIFE STRESSORS:22831} ***  Previous mental health services Have you ever been treated for a mental health problem, when, where, by whom? No  ***   Are you currently seeing a therapist or counselor, counselor's name? No ***  Have you ever had a mental health hospitalization, how many times, length of stay? No ***  Have you ever been treated with medication, name, reason, response? {BHH YES OR NO:22294} ***  Have you ever had suicidal thoughts or attempted suicide, when, how? {BHH YES OR NO:22294} ***  Risk factors for Suicide Demographic factors:  {CHL AMB BH Suicide Demographics:21022747:a} Current mental status: {CHL AMB BH Suicide Current Mental Status:21022748:a} Loss factors: {CHL AMB BH Suicide Loss Factors:21022749:a} Historical factors: {CHL AMB BH Suicide Historical Factors:21022750:a} Risk Reduction factors: {CHL AMB BH Suicide Risk Reduction Factors:21022751:a} Clinical factors:  {Clinical Factors:22706} Cognitive features that contribute to risk: {Cognitive Features:22703}    SUICIDE RISK:  Minimal: No identifiable suicidal ideation.  Patients presenting with no risk factors but with morbid ruminations; may be classified as minimal risk based on the severity of the depressive symptoms  Medical history Medical treatment and/or problems, explain: Yes *** Do you have any issues with chronic pain?  {BHH YES OR ZO:10960} *** Name of primary care physician/last physical exam: ***  Allergies: Yes Medication, reactions? Arithromycin, Erythromycin, Morphine, Tetracyline, Sulfonamide derivitives   Current medications: See medication section Prescribed by: *** Is there any history of mental health problems or substance abuse in your family, whom? {BHH YES OR NO:22294} *** Has anyone in your family been hospitalized, who, where, length of stay? {BHH YES OR AV:40981}  ***  Social/family history Have you been married, how many times?  Twice  Do you have children?  Yes  Five children   How many pregnancies have you had?  5  Who lives in your current household?  Husband, patient,   Military history: No ***  Religious/spiritual involvement:  What religion/faith base are you? She is going to [employer].  Husband raised Catholic  Family of origin (childhood history)  Where were you born? *** Where did you grow up? *** How many different homes have you lived? *** Describe the atmosphere of the household where you grew up: *** Do you have siblings, step/half siblings, list names, relation, sex, age? {BHH YES OR XB:14782} ***  Are your parents separated/divorced, when and why? {BHH YES OR NF:62130} ***  Are your parents alive? {BHH YES OR QM:57846} ***  Social supports (personal and professional): ***  Education How many grades have you completed? {misc; education:31912} Did you have any problems in school, what type? {BHH YES OR NG:29528} *** Medications prescribed for these problems? {BHH YES OR UX:32440} ***  Employment (financial issues)  She is working 60 hours a week and doing well financially for she is being paid for the overtime.   Legal history  None   Trauma/Abuse history: Have you ever been exposed to any form of abuse, what type? {BHH YES OR NO:22294} {Type of abuse:20566}  Have you ever been exposed to something traumatic, describe? {BHH YES OR NU:27253} ***  Substance use Do you use Caffeine? Yes Type, frequency? ***  Do you use Nicotine? Yes Type, frequency, ppd? ***   Do you use Alcohol? Yes Type, frequency? ***  How old were you went you first tasted alcohol? *** Was this accepted by your family? {BHH  YES OR NO:22294}  When was your last drink, type, how much? ***  Have you ever used illicit drugs or taken more than prescribed, type, frequency, date of last usage? No ***  Mental Status: General  Appearance Luretha Murphy:  Neat Eye Contact:  Good Motor Behavior:  Normal Speech:  Normal Level of Consciousness:  Alert Mood:  Anxious Affect:  Appropriate Anxiety Level:  Minimal Thought Process:  Coherent and Relevant Thought Content:  WNL Perception:  Normal Judgment:  Good Insight:  Present Cognition:  Concentration Yes  Diagnosis AXIS I Adjustment Disorder with Anxiety  AXIS II Deferred  AXIS III Past Medical History  Diagnosis Date  . Hep A w/o coma   . Depression   . Allergy   . Phlebitis   . Whitlow   . Atypical chest pain   . GERD (gastroesophageal reflux disease)     AXIS IV   AXIS V 61-70 mild symptoms   Plan: ***  _________________________________________          Merlene Morse MSW, LCSW/ Date  04/16/12

## 2012-05-16 ENCOUNTER — Encounter (HOSPITAL_COMMUNITY): Payer: Self-pay | Admitting: Licensed Clinical Social Worker

## 2012-07-27 ENCOUNTER — Encounter: Payer: Self-pay | Admitting: Family Medicine

## 2012-07-27 ENCOUNTER — Ambulatory Visit (INDEPENDENT_AMBULATORY_CARE_PROVIDER_SITE_OTHER): Payer: BC Managed Care – PPO | Admitting: Family Medicine

## 2012-07-27 VITALS — BP 122/79 | HR 79 | Temp 97.9°F | Wt 167.0 lb

## 2012-07-27 DIAGNOSIS — J209 Acute bronchitis, unspecified: Secondary | ICD-10-CM

## 2012-07-27 DIAGNOSIS — R52 Pain, unspecified: Secondary | ICD-10-CM

## 2012-07-27 LAB — POCT INFLUENZA A/B: Influenza B, POC: NEGATIVE

## 2012-07-27 MED ORDER — CEFDINIR 300 MG PO CAPS: 300.0000 mg | ORAL_CAPSULE | Freq: Two times a day (BID) | ORAL | Status: DC

## 2012-07-27 MED ORDER — HYDROCODONE-HOMATROPINE 5-1.5 MG/5ML PO SYRP: 5.0000 mL | ORAL_SOLUTION | Freq: Every evening | ORAL | Status: DC | PRN

## 2012-07-27 NOTE — Progress Notes (Signed)
CC: Kari Rowland is a 51 y.o. female is here for flu like sxs   Subjective: HPI:  Patient reports 3 days of sudden onset fevers(max 100.9), fatigue, muscle aches, productive cough(nonbloody), sore throat and headaches. Symptoms are getting somewhat worse since the onset. She's been taking ibuprofen Tylenol and guaifenesin products with only mild improvement of her discomfort. Overall symptoms are discomforting on a moderate severity. Symptoms present 24 hours a day, somewhat interfering with sleep. Denies shortness of breath, wheezing, chest pain, back pain, abdominal pain, nausea, vomiting, diarrhea, rashes, headaches nor confusion. She continues to smoke    Review Of Systems Outlined In HPI  Past Medical History  Diagnosis Date  . Hep A w/o coma   . Depression   . Allergy   . Phlebitis   . Whitlow   . Atypical chest pain   . GERD (gastroesophageal reflux disease)   . Adjustment disorder with anxious mood 04/22/2012  . MENOPAUSE-RELATED VASOMOTOR SYMPTOMS 12/11/2009    Qualifier: Diagnosis of  By: Fabian Sharp MD, Neta Mends   . TOBACCO USE 08/20/2007    Qualifier: Diagnosis of  By: Fabian Sharp MD, Neta Mends   . HYPERCHOLESTEROLEMIA 09/20/2010    Qualifier: Diagnosis of  By: Thomos Lemons    . AMENORRHEA, SECONDARY 09/20/2010    Qualifier: Diagnosis of  By: Thomos Lemons       Family History  Problem Relation Age of Onset  . Heart disease      CAD  . Thyroid disease Mother   . Hypertension Mother   . Scleroderma    . Diabetes Father      History  Substance Use Topics  . Smoking status: Current Every Day Smoker -- 1.0 packs/day for 15 years    Types: Cigarettes  . Smokeless tobacco: Never Used  . Alcohol Use: Yes     Comment: wine intermittently     Objective: Filed Vitals:   07/27/12 1532  BP: 122/79  Pulse: 79  Temp: 97.9 F (36.6 C)    General: Alert and Oriented, No Acute Distress HEENT: Pupils equal, round, reactive to light. Conjunctivae clear.  External ears  unremarkable, canals clear with intact TMs with appropriate landmarks.  Middle ear appears open without effusion. Pink inferior turbinates.  Moist mucous membranes, pharynx with moderate erythema and without lesions.  Neck supple without palpable lymphadenopathy nor abnormal masses. Lungs: Comfortable work of breathing, distant lung sounds throughout all lung fields, no signs of consolidation, there is moderate rhonchi without wheezing nor rales Cardiac: Regular rate and rhythm. Normal S1/S2.  No murmurs, rubs, nor gallops.   Extremities: No peripheral edema.  Strong peripheral pulses.  Mental Status: No depression, anxiety, nor agitation. Skin: Warm and dry.  Assessment & Plan: Lamiyah was seen today for flu like sxs.  Diagnoses and associated orders for this visit:  Body aches - POCT Influenza A/B  Acute bronchitis - cefdinir (OMNICEF) 300 MG capsule; Take 1 capsule (300 mg total) by mouth 2 (two) times daily. - HYDROcodone-homatropine (HYCODAN) 5-1.5 MG/5ML syrup; Take 5 mLs by mouth at bedtime as needed for cough.    Rapid flu negative, clinical suspicion is low for influenza. Suspect influenza-like illness/common cold leading to bacterial bronchitis. Given allergies we'll treat with Truman Hayward, Hycodan for sleep, discussed morphine allergy of vomiting 20 years ago.  Return if symptoms worsen or fail to improve.

## 2012-07-30 ENCOUNTER — Ambulatory Visit (INDEPENDENT_AMBULATORY_CARE_PROVIDER_SITE_OTHER): Payer: BC Managed Care – PPO | Admitting: Family Medicine

## 2012-07-30 ENCOUNTER — Ambulatory Visit (INDEPENDENT_AMBULATORY_CARE_PROVIDER_SITE_OTHER): Payer: BC Managed Care – PPO

## 2012-07-30 ENCOUNTER — Encounter: Payer: Self-pay | Admitting: Family Medicine

## 2012-07-30 VITALS — BP 139/90 | HR 105 | Temp 98.7°F | Ht 65.0 in | Wt 165.0 lb

## 2012-07-30 DIAGNOSIS — R0989 Other specified symptoms and signs involving the circulatory and respiratory systems: Secondary | ICD-10-CM

## 2012-07-30 DIAGNOSIS — R05 Cough: Secondary | ICD-10-CM

## 2012-07-30 DIAGNOSIS — R0602 Shortness of breath: Secondary | ICD-10-CM

## 2012-07-30 DIAGNOSIS — B9689 Other specified bacterial agents as the cause of diseases classified elsewhere: Secondary | ICD-10-CM

## 2012-07-30 DIAGNOSIS — A499 Bacterial infection, unspecified: Secondary | ICD-10-CM

## 2012-07-30 DIAGNOSIS — R509 Fever, unspecified: Secondary | ICD-10-CM

## 2012-07-30 DIAGNOSIS — R059 Cough, unspecified: Secondary | ICD-10-CM

## 2012-07-30 DIAGNOSIS — J209 Acute bronchitis, unspecified: Secondary | ICD-10-CM

## 2012-07-30 MED ORDER — LEVOFLOXACIN 750 MG PO TABS: 750.0000 mg | ORAL_TABLET | Freq: Every day | ORAL | Status: AC

## 2012-07-30 NOTE — Progress Notes (Signed)
CC: Kari Rowland is a 51 y.o. female is here for Fever   Subjective: HPI:  Patient presents due to concerns of cough. Has been present ever since Sunday, getting worse on a daily basis. Now accompanied by shortness of breath and fatigue. Fatigue and shortness of breath or worse and by doing the dishes and showering, any other easy housework. The constellation of symptoms seem to be present throughout the day. She was given Omnicef earlier in the week but has had no improvement. She's had fevers up to 103 that have responded to ibuprofen. Symptoms are moderate to severe in severity for the patient.  Nothing else makes better or worse other than above. She states she feels much worse than earlier this week.  She denies muscle or joint pain, nausea or vomiting, GI disturbance, nasal discharge, earaches, confusion, dizziness, motor or sensory disturbances, chest pain, nor upper back pain. She reports wheezing in the evenings   Review Of Systems Outlined In HPI  Past Medical History  Diagnosis Date  . Hep A w/o coma   . Depression   . Allergy   . Phlebitis   . Whitlow   . Atypical chest pain   . GERD (gastroesophageal reflux disease)   . Adjustment disorder with anxious mood 04/22/2012  . MENOPAUSE-RELATED VASOMOTOR SYMPTOMS 12/11/2009    Qualifier: Diagnosis of  By: Fabian Sharp MD, Neta Mends   . TOBACCO USE 08/20/2007    Qualifier: Diagnosis of  By: Fabian Sharp MD, Neta Mends   . HYPERCHOLESTEROLEMIA 09/20/2010    Qualifier: Diagnosis of  By: Thomos Lemons    . AMENORRHEA, SECONDARY 09/20/2010    Qualifier: Diagnosis of  By: Thomos Lemons       Family History  Problem Relation Age of Onset  . Heart disease      CAD  . Thyroid disease Mother   . Hypertension Mother   . Scleroderma    . Diabetes Father      History  Substance Use Topics  . Smoking status: Current Every Day Smoker -- 1.0 packs/day for 15 years    Types: Cigarettes  . Smokeless tobacco: Never Used  . Alcohol Use: Yes   Comment: wine intermittently     Objective: Filed Vitals:   07/30/12 1120  BP: 139/90  Pulse: 105  Temp: 98.7 F (37.1 C)    General: Alert and Oriented, fatigue HEENT: Pupils equal, round, reactive to light. Conjunctivae clear.  External ears unremarkable, canals clear with intact TMs with appropriate landmarks.  Middle ear appears open without effusion. Pink inferior turbinates.  Moist mucous membranes, pharynx without inflammation nor lesions.  Neck supple without palpable lymphadenopathy nor abnormal masses. Lungs: Clear to auscultation bilaterally, no wheezing/ronchi/rales.  Comfortable work of breathing. Good air movement.  No sensory muscle use. Speaking full sentences Cardiac: Regular rate and rhythm. Normal S1/S2.  No murmurs, rubs, nor gallops.   Extremities: No peripheral edema.  Strong peripheral pulses.  Mental Status: No depression, anxiety, nor agitation. Skin: Warm and dry.  Assessment & Plan: Aniya was seen today for fever.  Diagnoses and associated orders for this visit:  Acute bacterial bronchitis  Cough - DG Chest 2 View; Future - levofloxacin (LEVAQUIN) 750 MG tablet; Take 1 tablet (750 mg total) by mouth daily.  Sob (shortness of breath) - DG Chest 2 View; Future - levofloxacin (LEVAQUIN) 750 MG tablet; Take 1 tablet (750 mg total) by mouth daily.    Chest x-ray obtained: Personal interpretation shows no pulmonary edema, no  signs of consolidation. Bronchial thickening. Review of radiology read confirms the above with a granuloma in the left lower lobe. Reassuring that oxygen saturations did not drop while walking. Low suspicion for pneumonia, however bronchitis not appropriately treated with Omnicef. Start Levaquin. Discussed Tamiflu I feel even if she has the flu she is outside of the therapeutic window.Signs and symptoms requring emergent/urgent reevaluation were discussed with the patient. Try albuterol at night when wheezing if a response.  Return  if symptoms worsen or fail to improve.

## 2012-08-10 ENCOUNTER — Other Ambulatory Visit: Payer: Self-pay | Admitting: Family Medicine

## 2012-08-22 IMAGING — MR MR LUMBAR SPINE W/O CM
4 of 5 series · 22 of 48 positions shown · non-contrast
Comparison: None.

CLINICAL DATA: Severe low back pain and bilateral hip and leg pain.
Bilateral leg weakness.

MRI LUMBAR SPINE WITHOUT CONTRAST
TECHNIQUE: Multiplanar and multiecho pulse sequences of the lumbar
spine were obtained without intravenous contrast.

[Series 3: T2 · sagittal · 4.5mm · 0.44mm/px · 6 of 12 slices shown (1 of 2)]
[im 1/12]
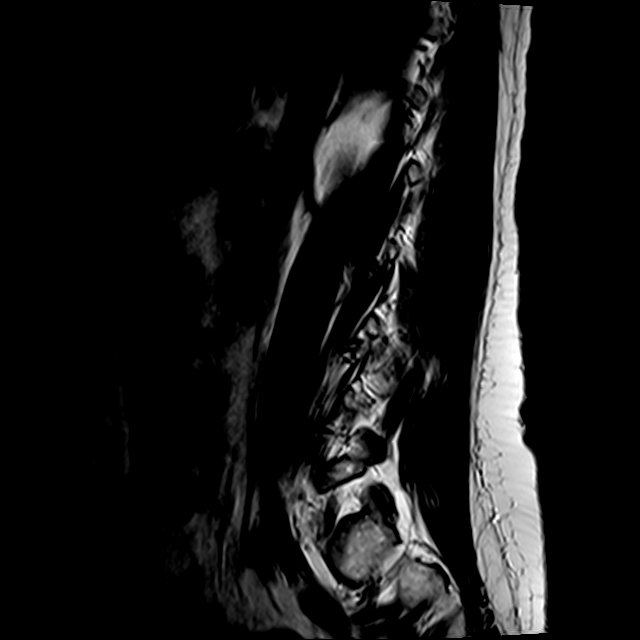
[im 3/12]
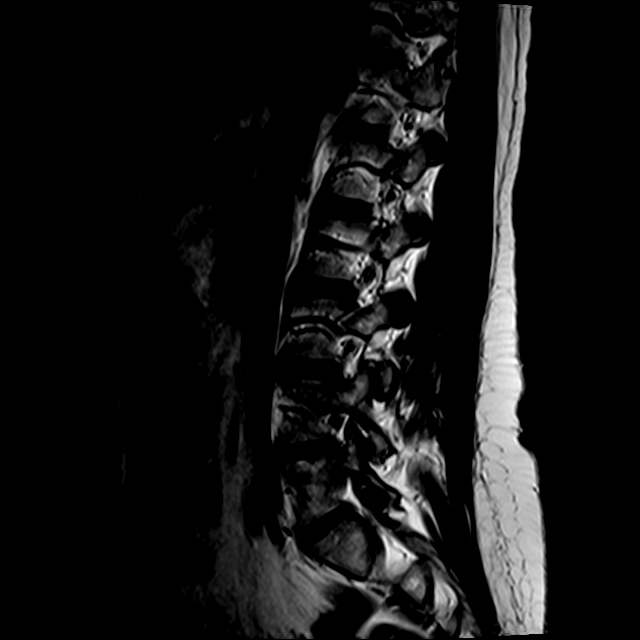
[im 5/12]
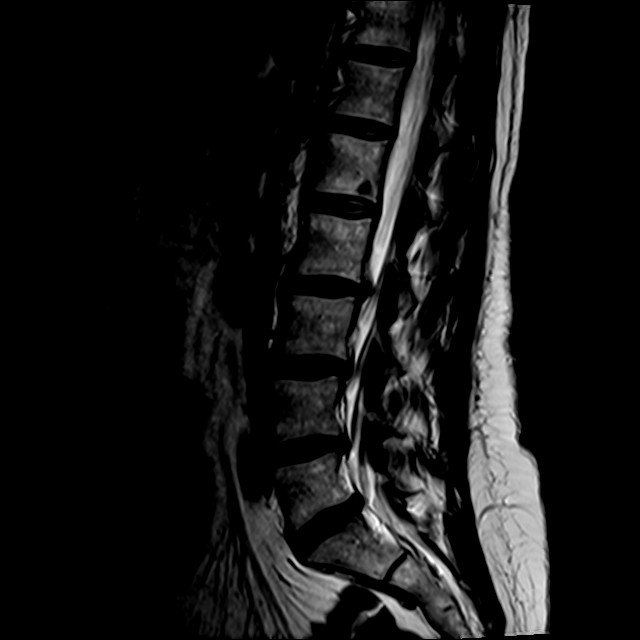
[im 7/12]
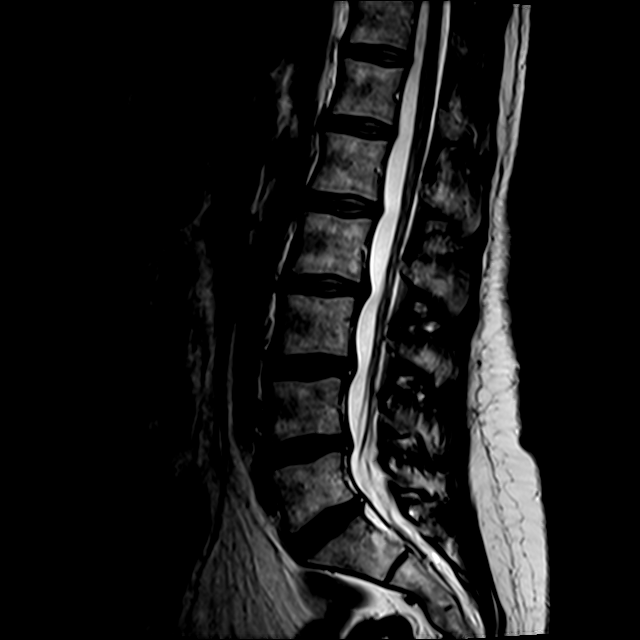
[im 9/12]
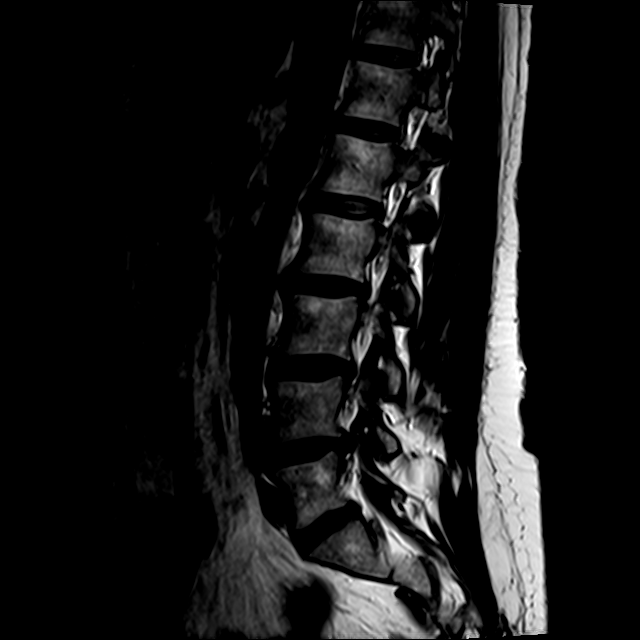
[im 12/12]
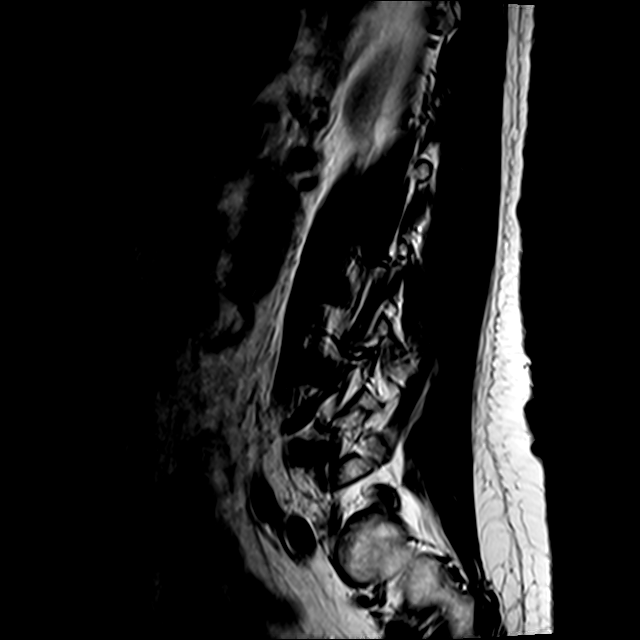

[Series 4: T1 · sagittal · 4.5mm · 0.55mm/px · 4 of 12 slices shown (1 of 2)]
[im 1/12]
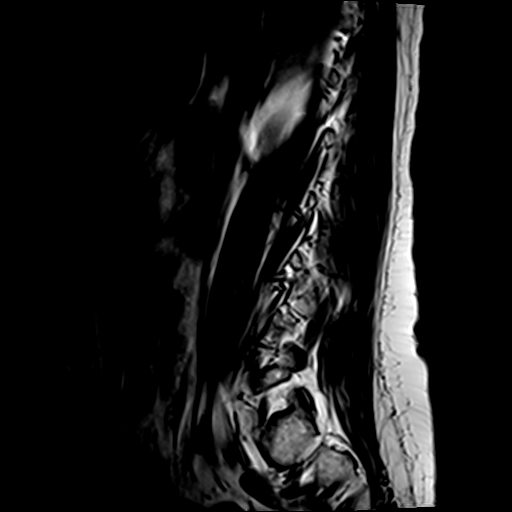
[im 3/12]
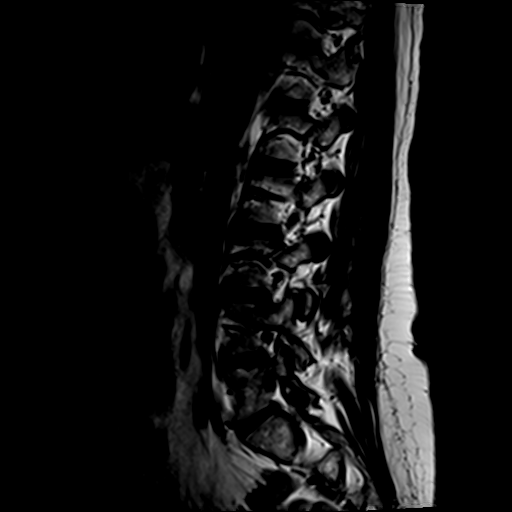
[im 7/12]
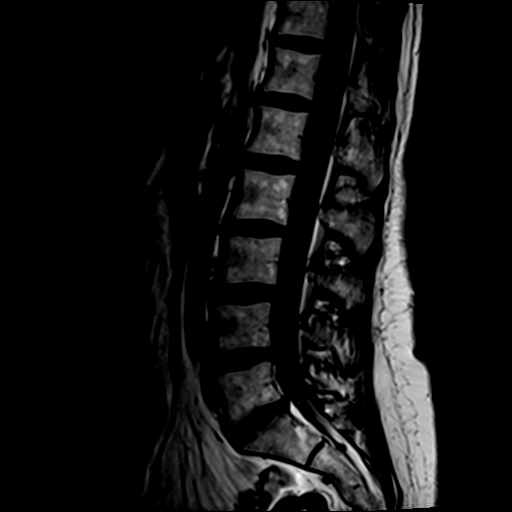
[im 12/12]
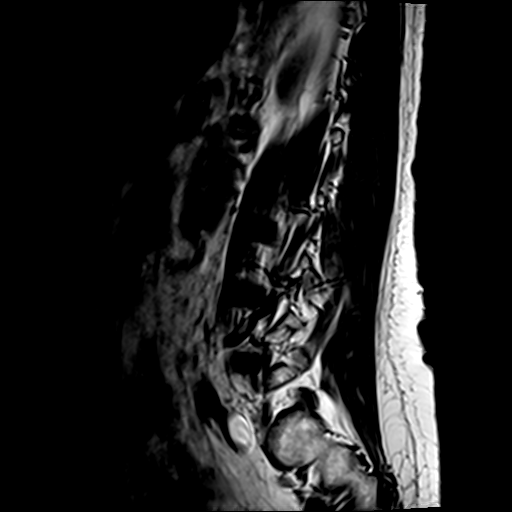

[Series 6: T1 · axial · 4.0mm · 0.42mm/px · z∈[-58,+68]mm · 3 of 29 slices shown (2 of 2)]
[im 5/29]
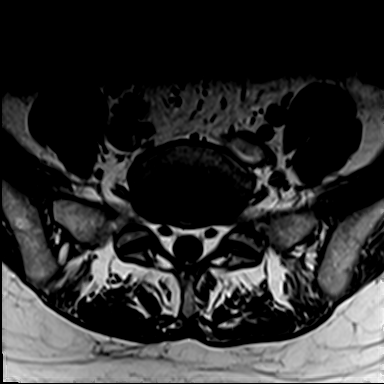
[im 15/29]
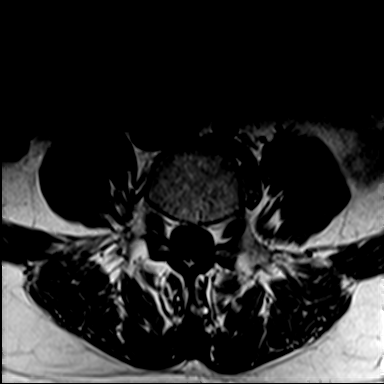
[im 25/29]
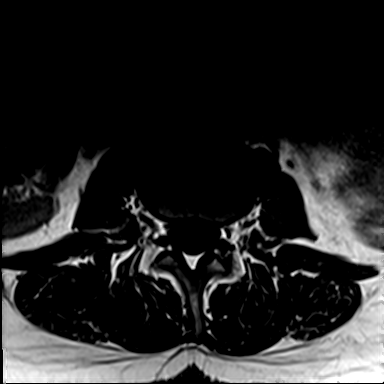

[Series 7: T2 · axial · 4.0mm · 0.39mm/px · z∈[-74,+111]mm · 9 of 29 slices shown (2 of 2)]
[im 1/29]
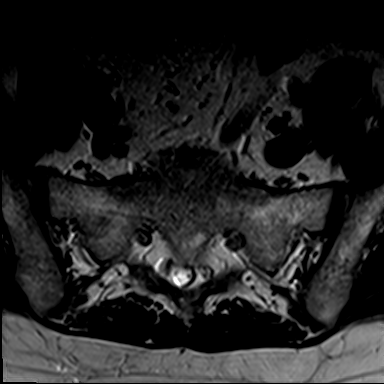
[im 5/29]
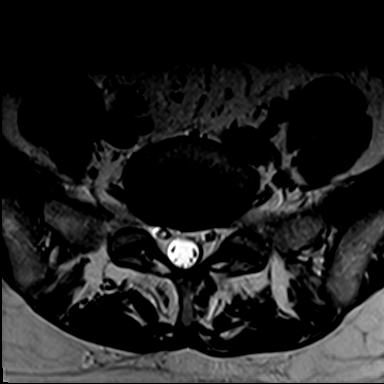
[im 9/29]
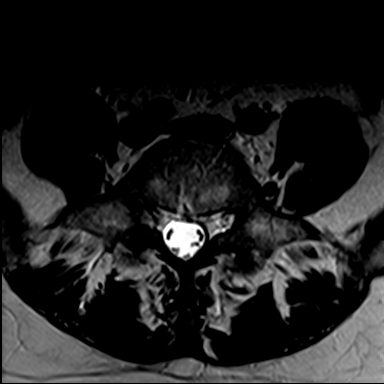
[im 13/29]
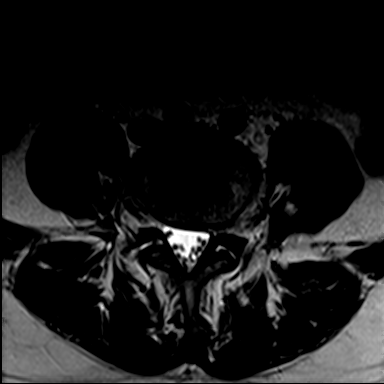
[im 15/29]
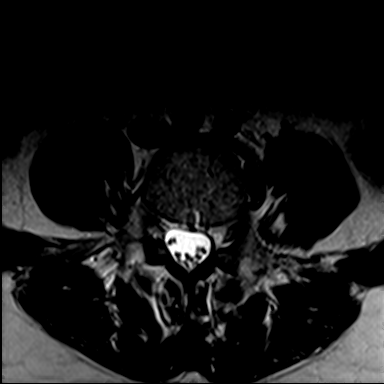
[im 17/29]
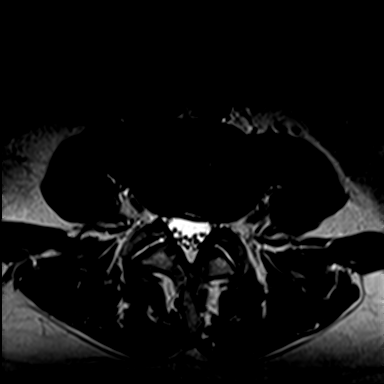
[im 21/29]
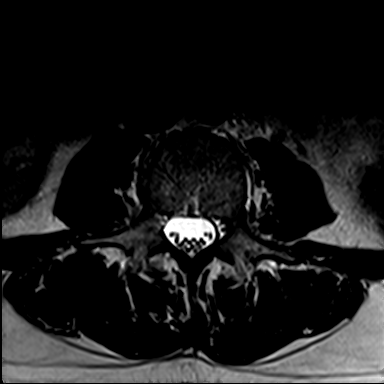
[im 25/29]
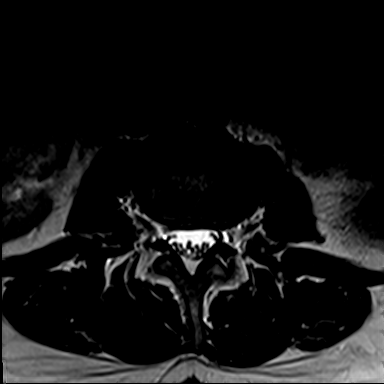
[im 29/29]
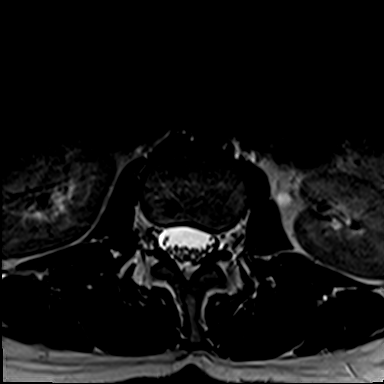

[22 of 48 positions shown; findings below may reference images not displayed]

FINDINGS: The scan extends from T11-12 through S2.  Tip of the
conus is at L1-2 and appears normal.

T11-12 and T12-L1:  Normal.

L1-2:  Tiny asymmetric disc bulge of the left of midline with no
impingement.

L2-3:  Small asymmetric disc protrusion central and to the right
touching the thecal sac but not compressing the nerves.

L3-4:  Central annular tear and small central disc bulge without
significant impingement.

L4-5:  Broad-based annular tear and small asymmetric disc
protrusion extending from right to left with slight compression of
the left lateral recess but without focal impingement upon the L5
nerve roots.

L5-S1:  There is a broad-based central annular tear and disc bulge
without impingement.

The paraspinal soft tissues are normal.
IMPRESSION: 1.  Small asymmetric disc protrusion at L4-5 with narrowing of the
left lateral recess but without focal nerve root impingement.
2.  Small asymmetric disc protrusion at L2-3 to the right without
impingement.
3.  Annular tears of the discs at L3-4 and L5 S1.

## 2012-10-19 ENCOUNTER — Other Ambulatory Visit: Payer: Self-pay | Admitting: *Deleted

## 2012-10-22 ENCOUNTER — Ambulatory Visit (INDEPENDENT_AMBULATORY_CARE_PROVIDER_SITE_OTHER): Payer: BC Managed Care – PPO | Admitting: Sports Medicine

## 2012-10-22 ENCOUNTER — Encounter: Payer: Self-pay | Admitting: Sports Medicine

## 2012-10-22 VITALS — BP 142/80 | HR 83 | Wt 177.0 lb

## 2012-10-22 DIAGNOSIS — M5416 Radiculopathy, lumbar region: Secondary | ICD-10-CM | POA: Insufficient documentation

## 2012-10-22 DIAGNOSIS — M76899 Other specified enthesopathies of unspecified lower limb, excluding foot: Secondary | ICD-10-CM

## 2012-10-22 DIAGNOSIS — M7061 Trochanteric bursitis, right hip: Secondary | ICD-10-CM

## 2012-10-22 DIAGNOSIS — IMO0002 Reserved for concepts with insufficient information to code with codable children: Secondary | ICD-10-CM

## 2012-10-22 MED ORDER — PREDNISONE 10 MG PO TABS: ORAL_TABLET | ORAL | Status: DC

## 2012-10-22 NOTE — Assessment & Plan Note (Signed)
Injection of the trochanteric bursae as above. Hip abductor rehabilitation. Return in 4 weeks.

## 2012-10-22 NOTE — Patient Instructions (Addendum)
Hip Rehabilitation Protocol:  1.  Side leg raises.  3x30 with no weight, then 3x15 with 2 lb ankle weight, then 3x15 with 5 lb ankle weight 2.  Standing hip rotation.  3x30 with no weight, then 3x15 with 2 lb ankle weight, then 3x15 with 5 lb ankle weight. 3.  Side step ups.  3x30 with no weight, then 3x15 with 5 lbs in backpack, then 3x15 with 10 lbs in backpack. 

## 2012-10-22 NOTE — Assessment & Plan Note (Signed)
Status post formal physical therapy, oral medications, steroids, gabapentin, muscle relaxers. Steroid dose packs have helped well in the past. She's never had epidural injections. She desires to repeat the dose pack today, and if no better we can pursue transforaminal epidural. 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 Symptoms today are predominant on the right side she also has a smaller foraminal disc for treated with foraminal stenosis at the L4-L5 level, the disc does appear to contact the exiting L4 nerve root on MRI.

## 2012-10-22 NOTE — Progress Notes (Signed)
  Subjective:    CC: Couple complaints  HPI: Low back pain: Recurrent, present in the past, worse with flexion and sitting in a car for long periods of time, does get occasional radiation down the left leg, today however is radiating down the right leg in an L4 versus L5 distribution. Worse with Valsalva. She has had oral steroids as well as intramuscular steroid which has been highly effective and she is desiring this again today. She has already been through formal physical therapy, has never had injections. She has had an MRI.  Bilateral hip pain: Pain is localized over both trochanteric bursae. Worse with palpation and laying on the side. His localized, doesn't radiate, severe.  Past medical history, Surgical history, Family history not pertinant except as noted below, Social history, Allergies, and medications have been entered into the medical record, reviewed, and no changes needed.   Review of Systems: No headache, visual changes, nausea, vomiting, diarrhea, constipation, dizziness, abdominal pain, skin rash, fevers, chills, night sweats, weight loss, swollen lymph nodes, body aches, joint swelling, muscle aches, chest pain, shortness of breath, mood changes, visual or auditory hallucinations.   Objective:   General: Well Developed, well nourished, and in no acute distress.  Neuro/Psych: Alert and oriented x3, extra-ocular muscles intact, able to move all 4 extremities, sensation grossly intact. Skin: Warm and dry, no rashes noted.  Respiratory: Not using accessory muscles, speaking in full sentences, trachea midline.  Cardiovascular: Pulses palpable, no extremity edema. Abdomen: Does not appear distended. Bilateral Hip: ROM IR: 45 Deg, ER: 45 Deg, Flexion: 120 Deg, Extension: 100 Deg, Abduction: 45 Deg, Adduction: 45 Deg Strength IR: 5/5, ER: 5/5, Flexion: 5/5, Extension: 5/5, Abduction: 5/5, Adduction: 5/5 Pelvic alignment unremarkable to inspection and palpation. Standing hip  rotation and gait without trendelenburg sign / unsteadiness. Exquisite tenderness to palpation of the greater trochanter on both sides. No pain with FABER or FADIR. No SI joint tenderness and normal minimal SI movement. Back Exam:  Inspection: Unremarkable  Motion: Flexion 45 deg, Extension 45 deg, Side Bending to 45 deg bilaterally,  Rotation to 45 deg bilaterally  SLR laying: Negative  XSLR laying: Negative  Palpable tenderness: None. FABER: negative. Sensory change: Gross sensation intact to all lumbar and sacral dermatomes.  Reflexes: 2+ at both patellar tendons, 2+ at achilles tendons, Babinski's downgoing.  Strength at foot  Plantar-flexion: 5/5 Dorsi-flexion: 5/5 Eversion: 5/5 Inversion: 5/5  Leg strength  Quad: 5/5 Hamstring: 5/5 Hip flexor: 5/5 Hip abductors: 5/5  Gait unremarkable.  MRI was reviewed, is multilevel degenerative disc disease worse at the L4-L5 level with a very large left-sided broad-based and left foraminal disc protrusion, there is also some right-sided foraminal stenosis.  Procedure:  Injection of right and left trochanteric bursae Consent obtained and verified. Time-out conducted. Noted no overlying erythema, induration, or other signs of local infection. Skin prepped in a sterile fashion. Topical analgesic spray: Ethyl chloride. Completed without difficulty. Meds: Spinal needle advanced the greater trochanter, bounced off of bone, 1 cc Kenalog 40, 4 cc lidocaine injected in a fanlike pattern, this was repeated on the contralateral side. Pain immediately improved suggesting accurate placement of the medication. Advised to call if fevers/chills, erythema, induration, drainage, or persistent bleeding. Impression and Recommendations:   This case required medical decision making of moderate complexity.

## 2012-10-28 ENCOUNTER — Ambulatory Visit: Payer: BC Managed Care – PPO | Admitting: Family Medicine

## 2012-11-09 ENCOUNTER — Encounter: Payer: BC Managed Care – PPO | Admitting: Family Medicine

## 2012-11-22 ENCOUNTER — Ambulatory Visit: Payer: BC Managed Care – PPO | Admitting: Sports Medicine

## 2013-02-08 NOTE — Progress Notes (Signed)
   THERAPIST PROGRESS NOTE  Session Time: 4:00 - 4:50  Participation Level: Active  Behavioral Response:AlertEuthymic  Type of Therapy: Individual Therapy  Treatment Goals addressed: Anxiety  Interventions: Motivational Interviewing, Solution Focused and Supportive  Summary: Khira Cudmore is a 52 y.o. female who presents with problems with working out differences with her husband.  Discussed different ways of dealing with her feelings about their differences.  Talking with him does not seem to have made  much of a change.She really cannot change him but she can change herself and her reactions to situations.  She certainly does not have to put up with disrespect from the teens.  No need to give or put yourself out for unappreciative youngsters.   Aala needs to learn how to say "no".  She sounds like she is over functioning and everyone else is doing pretty much what they want to do.  She is also being over responsible.  This will be the crux of the work she needs to do in counseling - how to take care of herself and defining what that means for her..   Suicidal/Homicidal: No  Therapist Response: She has good capacity to learn and to make changes  Plan: Return again in 2 weeks.  Diagnosis: Axis I: Adjustment Disorder with Anxiety    Axis II: Deferred    Sade Hollon,JUDITH A, LCSW 02/08/2013

## 2018-01-15 ENCOUNTER — Ambulatory Visit: Payer: Federal, State, Local not specified - PPO | Admitting: Clinical

## 2018-01-15 DIAGNOSIS — F331 Major depressive disorder, recurrent, moderate: Secondary | ICD-10-CM

## 2018-02-03 ENCOUNTER — Ambulatory Visit: Payer: Federal, State, Local not specified - PPO | Admitting: Clinical

## 2018-02-03 DIAGNOSIS — F4323 Adjustment disorder with mixed anxiety and depressed mood: Secondary | ICD-10-CM

## 2018-02-17 ENCOUNTER — Ambulatory Visit: Payer: Federal, State, Local not specified - PPO | Admitting: Clinical

## 2018-03-17 DIAGNOSIS — H16142 Punctate keratitis, left eye: Secondary | ICD-10-CM | POA: Diagnosis not present

## 2018-07-29 DIAGNOSIS — M7542 Impingement syndrome of left shoulder: Secondary | ICD-10-CM | POA: Diagnosis not present

## 2018-07-29 DIAGNOSIS — M25512 Pain in left shoulder: Secondary | ICD-10-CM | POA: Diagnosis not present

## 2018-08-13 DIAGNOSIS — M25512 Pain in left shoulder: Secondary | ICD-10-CM | POA: Diagnosis not present

## 2018-08-18 DIAGNOSIS — M7502 Adhesive capsulitis of left shoulder: Secondary | ICD-10-CM | POA: Diagnosis not present

## 2018-08-18 DIAGNOSIS — M25512 Pain in left shoulder: Secondary | ICD-10-CM | POA: Diagnosis not present

## 2018-08-20 DIAGNOSIS — M7502 Adhesive capsulitis of left shoulder: Secondary | ICD-10-CM | POA: Diagnosis not present

## 2018-08-24 DIAGNOSIS — M7502 Adhesive capsulitis of left shoulder: Secondary | ICD-10-CM | POA: Diagnosis not present

## 2018-08-30 DIAGNOSIS — M7502 Adhesive capsulitis of left shoulder: Secondary | ICD-10-CM | POA: Diagnosis not present

## 2018-08-31 DIAGNOSIS — M7502 Adhesive capsulitis of left shoulder: Secondary | ICD-10-CM | POA: Diagnosis not present

## 2018-08-31 DIAGNOSIS — R2232 Localized swelling, mass and lump, left upper limb: Secondary | ICD-10-CM | POA: Diagnosis not present

## 2018-09-07 DIAGNOSIS — M25512 Pain in left shoulder: Secondary | ICD-10-CM | POA: Diagnosis not present

## 2018-09-20 DIAGNOSIS — M25512 Pain in left shoulder: Secondary | ICD-10-CM | POA: Diagnosis not present

## 2018-10-01 DIAGNOSIS — M7502 Adhesive capsulitis of left shoulder: Secondary | ICD-10-CM | POA: Diagnosis not present

## 2018-10-01 DIAGNOSIS — M25512 Pain in left shoulder: Secondary | ICD-10-CM | POA: Diagnosis not present

## 2018-12-29 DIAGNOSIS — M7502 Adhesive capsulitis of left shoulder: Secondary | ICD-10-CM | POA: Diagnosis not present

## 2019-03-08 DIAGNOSIS — M5441 Lumbago with sciatica, right side: Secondary | ICD-10-CM | POA: Diagnosis not present

## 2019-03-08 DIAGNOSIS — M9903 Segmental and somatic dysfunction of lumbar region: Secondary | ICD-10-CM | POA: Diagnosis not present

## 2019-03-08 DIAGNOSIS — M9904 Segmental and somatic dysfunction of sacral region: Secondary | ICD-10-CM | POA: Diagnosis not present

## 2019-03-08 DIAGNOSIS — M4728 Other spondylosis with radiculopathy, sacral and sacrococcygeal region: Secondary | ICD-10-CM | POA: Diagnosis not present

## 2019-03-09 DIAGNOSIS — M9901 Segmental and somatic dysfunction of cervical region: Secondary | ICD-10-CM | POA: Diagnosis not present

## 2019-03-09 DIAGNOSIS — M9904 Segmental and somatic dysfunction of sacral region: Secondary | ICD-10-CM | POA: Diagnosis not present

## 2019-03-09 DIAGNOSIS — M9903 Segmental and somatic dysfunction of lumbar region: Secondary | ICD-10-CM | POA: Diagnosis not present

## 2019-03-09 DIAGNOSIS — M9902 Segmental and somatic dysfunction of thoracic region: Secondary | ICD-10-CM | POA: Diagnosis not present

## 2019-03-10 DIAGNOSIS — M9903 Segmental and somatic dysfunction of lumbar region: Secondary | ICD-10-CM | POA: Diagnosis not present

## 2019-03-10 DIAGNOSIS — M9901 Segmental and somatic dysfunction of cervical region: Secondary | ICD-10-CM | POA: Diagnosis not present

## 2019-03-10 DIAGNOSIS — M9902 Segmental and somatic dysfunction of thoracic region: Secondary | ICD-10-CM | POA: Diagnosis not present

## 2019-03-10 DIAGNOSIS — M9904 Segmental and somatic dysfunction of sacral region: Secondary | ICD-10-CM | POA: Diagnosis not present

## 2019-03-15 DIAGNOSIS — M9902 Segmental and somatic dysfunction of thoracic region: Secondary | ICD-10-CM | POA: Diagnosis not present

## 2019-03-15 DIAGNOSIS — M9901 Segmental and somatic dysfunction of cervical region: Secondary | ICD-10-CM | POA: Diagnosis not present

## 2019-03-15 DIAGNOSIS — M9903 Segmental and somatic dysfunction of lumbar region: Secondary | ICD-10-CM | POA: Diagnosis not present

## 2019-03-15 DIAGNOSIS — M9904 Segmental and somatic dysfunction of sacral region: Secondary | ICD-10-CM | POA: Diagnosis not present

## 2019-03-16 DIAGNOSIS — M9903 Segmental and somatic dysfunction of lumbar region: Secondary | ICD-10-CM | POA: Diagnosis not present

## 2019-03-16 DIAGNOSIS — M9901 Segmental and somatic dysfunction of cervical region: Secondary | ICD-10-CM | POA: Diagnosis not present

## 2019-03-16 DIAGNOSIS — M9902 Segmental and somatic dysfunction of thoracic region: Secondary | ICD-10-CM | POA: Diagnosis not present

## 2019-03-16 DIAGNOSIS — M9904 Segmental and somatic dysfunction of sacral region: Secondary | ICD-10-CM | POA: Diagnosis not present

## 2019-03-17 DIAGNOSIS — M9904 Segmental and somatic dysfunction of sacral region: Secondary | ICD-10-CM | POA: Diagnosis not present

## 2019-03-17 DIAGNOSIS — M9902 Segmental and somatic dysfunction of thoracic region: Secondary | ICD-10-CM | POA: Diagnosis not present

## 2019-03-17 DIAGNOSIS — M9901 Segmental and somatic dysfunction of cervical region: Secondary | ICD-10-CM | POA: Diagnosis not present

## 2019-03-17 DIAGNOSIS — M9903 Segmental and somatic dysfunction of lumbar region: Secondary | ICD-10-CM | POA: Diagnosis not present

## 2019-03-21 DIAGNOSIS — M9902 Segmental and somatic dysfunction of thoracic region: Secondary | ICD-10-CM | POA: Diagnosis not present

## 2019-03-21 DIAGNOSIS — M9903 Segmental and somatic dysfunction of lumbar region: Secondary | ICD-10-CM | POA: Diagnosis not present

## 2019-03-21 DIAGNOSIS — M9901 Segmental and somatic dysfunction of cervical region: Secondary | ICD-10-CM | POA: Diagnosis not present

## 2019-03-21 DIAGNOSIS — M9904 Segmental and somatic dysfunction of sacral region: Secondary | ICD-10-CM | POA: Diagnosis not present

## 2019-03-22 ENCOUNTER — Other Ambulatory Visit: Payer: Self-pay

## 2019-03-22 ENCOUNTER — Encounter: Payer: Self-pay | Admitting: Family Medicine

## 2019-03-22 ENCOUNTER — Ambulatory Visit (INDEPENDENT_AMBULATORY_CARE_PROVIDER_SITE_OTHER): Payer: Federal, State, Local not specified - PPO | Admitting: Family Medicine

## 2019-03-22 ENCOUNTER — Ambulatory Visit: Payer: Self-pay

## 2019-03-22 DIAGNOSIS — G8929 Other chronic pain: Secondary | ICD-10-CM

## 2019-03-22 DIAGNOSIS — M5441 Lumbago with sciatica, right side: Secondary | ICD-10-CM

## 2019-03-22 DIAGNOSIS — M5442 Lumbago with sciatica, left side: Secondary | ICD-10-CM | POA: Diagnosis not present

## 2019-03-22 MED ORDER — NABUMETONE 750 MG PO TABS: 750.0000 mg | ORAL_TABLET | Freq: Two times a day (BID) | ORAL | 6 refills | Status: DC | PRN

## 2019-03-22 MED ORDER — TIZANIDINE HCL 2 MG PO TABS: 2.0000 mg | ORAL_TABLET | Freq: Four times a day (QID) | ORAL | 1 refills | Status: DC | PRN

## 2019-03-22 NOTE — Progress Notes (Signed)
Office Visit Note   Patient: Kari Rowland           Date of Birth: 1961/03/07           MRN: [id number] Visit Date: 03/22/2019 Requested by: Burnis Medin, MD [place],  [place] [id number] PCP: Burnis Medin, MD  Subjective: Chief Complaint  Patient presents with  . Lower Back - Pain    Chronic low back pain, progressively worsening. Flareups are usually controlled with steroid dosepacks. Has had 2 since June. Tried chiropractic care - made it worse.    HPI: She is here with low back pain.  Longstanding problems with her back, I saw her years ago and we ordered an MRI scan in 2012 showing L2-3 and L4-5 disc protrusions.  She has managed her pain over the years with occasional steroid Dosepaks.  When her back gives out, it happens without warning and her legs give out.  She has had multiple flareups this past year.  She started going to chiropractor with some improvement, but the more she went the worse her pain became.  She works primarily from home 4 days/week, but 1 day/week she has to drive to the office which is more than 30 minutes away.  Driving in a car that long makes her back pain much worse.  Denies any bowel or bladder dysfunction.  Ibuprofen helps but it is starting to irritate her stomach.  She feels that menopausal weight gain of 50 pounds has contributed significantly to her back pain.  She has not been able to lose the weight.               ROS: Denies fevers or chills.  Denies bowel or bladder dysfunction.  All other systems were reviewed and are negative.  Objective: Vital Signs: There were no vitals taken for this visit.  Physical Exam:  General:  Alert and oriented, in no acute distress. Pulm:  Breathing unlabored. Psy:  Normal mood, congruent affect. Skin: No visible rash.   Low back: No significant scoliosis.  Slightly tender near the L5-S1 level.  Moderately tender in the left sciatic notch.  Negative straight leg raise, lower extremity  strength and reflexes are still normal.    Imaging: X-rays lumbar spine: Very slight lumbar scoliosis.  She has moderate degenerative disc disease at L4-5 and L5-S1.  No sign of compression deformity or neoplasm.  She has moderate facet DJD at the lower levels and early SI joint DJD.    Assessment & Plan: 1.  Chronic low back pain, getting worse with bilateral leg pain occasionally. -We will try Relafen, Zanaflex as needed.  Referral to [facility] physical therapy.  MRI ordered.  If she fails to improve, then possibly lumbar epidural injection. -We discussed limiting processed carbohydrates and sweets for help with weight loss.  Would recommend lab evaluation including a full thyroid panel if unable to lose weight.     Procedures: No procedures performed  No notes on file     PMFS History: Patient Active Problem List   Diagnosis Date Noted  . Bilateral L4-L5 Lumbar radiculitis 10/22/2012  . Adjustment disorder with anxious mood 04/22/2012  . BPPV (benign paroxysmal positional vertigo) 03/21/2011  . HYPERCHOLESTEROLEMIA 09/20/2010  . AMENORRHEA, SECONDARY 09/20/2010  . HERPETIC WHITLOW 12/11/2009  . MENOPAUSE-RELATED VASOMOTOR SYMPTOMS 12/11/2009  . MICROSCOPIC HEMATURIA 08/21/2008  . UNSPECIFIED VITAMIN D DEFICIENCY 09/13/2007  . GOITER, UNSPECIFIED 08/20/2007  . TOBACCO USE 08/20/2007  . ALLERGIC RHINITIS 08/20/2007  .  Trochanteric bursitis of both hips 08/20/2007   Past Medical History:  Diagnosis Date  . Adjustment disorder with anxious mood 04/22/2012  . Allergy   . AMENORRHEA, SECONDARY 09/20/2010   Qualifier: Diagnosis of  By: Esmeralda Arthur    . Atypical chest pain   . Depression   . GERD (gastroesophageal reflux disease)   . Hep A w/o coma   . HYPERCHOLESTEROLEMIA 09/20/2010   Qualifier: Diagnosis of  By: Esmeralda Arthur    . MENOPAUSE-RELATED VASOMOTOR SYMPTOMS 12/11/2009   Qualifier: Diagnosis of  By: Regis Bill MD, Standley Brooking   . Phlebitis   . TOBACCO USE  08/20/2007   Qualifier: Diagnosis of  By: Regis Bill MD, Standley Brooking   . Whitlow     Family History  Problem Relation Age of Onset  . Heart disease Unknown        CAD  . Thyroid disease Mother   . Hypertension Mother   . Scleroderma Unknown   . Diabetes Father     Past Surgical History:  Procedure Laterality Date  . ABDOMINAL HYSTERECTOMY    . BREAST SURGERY    . CERVICAL FUSION     Social History   Occupational History  . Not on file  Tobacco Use  . Smoking status: Current Every Day Smoker    Packs/day: 1.00    Years: 15.00    Pack years: 15.00    Types: Cigarettes  . Smokeless tobacco: Never Used  Substance and Sexual Activity  . Alcohol use: Yes    Comment: wine intermittently  . Drug use: No  . Sexual activity: Yes    Comment: adjudicator, RN [employer], single, engaged, household of 5 some caffeine.

## 2019-03-30 ENCOUNTER — Telehealth: Payer: Self-pay

## 2019-03-30 ENCOUNTER — Ambulatory Visit
Admission: RE | Admit: 2019-03-30 | Discharge: 2019-03-30 | Disposition: A | Discharge status: Home / Self Care | Payer: Federal, State, Local not specified - PPO | Source: Ambulatory Visit | Attending: Family Medicine | Admitting: Family Medicine

## 2019-03-30 DIAGNOSIS — G8929 Other chronic pain: Secondary | ICD-10-CM

## 2019-03-30 DIAGNOSIS — M48061 Spinal stenosis, lumbar region without neurogenic claudication: Secondary | ICD-10-CM | POA: Diagnosis not present

## 2019-03-30 DIAGNOSIS — M5442 Lumbago with sciatica, left side: Secondary | ICD-10-CM

## 2019-03-30 NOTE — Telephone Encounter (Signed)
I called to let the patient know her paperwork has been completed. She is to call back to let me know if she would like to pick it up or have it mailed to her.

## 2019-03-30 NOTE — Telephone Encounter (Signed)
Patient called back and requested that the form be faxed to [name] at [phone] and she would also like to pick up the original form.  Thank you

## 2019-03-30 NOTE — Telephone Encounter (Signed)
Done

## 2019-04-01 ENCOUNTER — Telehealth: Payer: Self-pay | Admitting: Family Medicine

## 2019-04-01 NOTE — Telephone Encounter (Signed)
Notify pt:   MRI shows disc protrusion at L2-3, similar to 2012. It may be slightly worse, but is not compressing any nerves at the time of the MRI scan.   The previously seen L4-5 disc protrusion has improved, and is now just a slight bulge with no nerve impingement. The nerve openings are slightly narrowed, but again no clear-cut indication for surgery based on the MRI findings.   I recommend PT, possibly epidural steroid injection if severe pain persists.

## 2019-04-01 NOTE — Telephone Encounter (Signed)
I called and advised the patient of the MRI results. She has the Rx for PT and plans to call and set up and appointment herself. I advised her to let us know if she continues to have the severe pain - for a possible ESI referral.

## 2019-04-01 NOTE — Telephone Encounter (Signed)
MRI shows disc protrusion at L2-3, similar to 2012.  It may be slightly worse, but is not compressing any nerves at the time of the MRI scan.  The previously seen L4-5 disc protrusion has improved, and is now just a slight bulge with no nerve impingement.  The nerve openings are slightly mottled, but again no clear-cut indication for surgery based on the MRI findings.  Recommend PT, possibly epidural steroid injection if severe pain persists.

## 2019-04-16 ENCOUNTER — Other Ambulatory Visit: Payer: Federal, State, Local not specified - PPO

## 2019-08-31 DIAGNOSIS — M9902 Segmental and somatic dysfunction of thoracic region: Secondary | ICD-10-CM | POA: Diagnosis not present

## 2019-08-31 DIAGNOSIS — M9903 Segmental and somatic dysfunction of lumbar region: Secondary | ICD-10-CM | POA: Diagnosis not present

## 2019-08-31 DIAGNOSIS — M9904 Segmental and somatic dysfunction of sacral region: Secondary | ICD-10-CM | POA: Diagnosis not present

## 2019-08-31 DIAGNOSIS — M9901 Segmental and somatic dysfunction of cervical region: Secondary | ICD-10-CM | POA: Diagnosis not present

## 2019-09-03 DIAGNOSIS — N390 Urinary tract infection, site not specified: Secondary | ICD-10-CM | POA: Diagnosis not present

## 2019-09-06 DIAGNOSIS — M9902 Segmental and somatic dysfunction of thoracic region: Secondary | ICD-10-CM | POA: Diagnosis not present

## 2019-09-06 DIAGNOSIS — M9904 Segmental and somatic dysfunction of sacral region: Secondary | ICD-10-CM | POA: Diagnosis not present

## 2019-09-06 DIAGNOSIS — M9901 Segmental and somatic dysfunction of cervical region: Secondary | ICD-10-CM | POA: Diagnosis not present

## 2019-09-06 DIAGNOSIS — M9903 Segmental and somatic dysfunction of lumbar region: Secondary | ICD-10-CM | POA: Diagnosis not present

## 2019-09-07 DIAGNOSIS — M9904 Segmental and somatic dysfunction of sacral region: Secondary | ICD-10-CM | POA: Diagnosis not present

## 2019-09-07 DIAGNOSIS — M9903 Segmental and somatic dysfunction of lumbar region: Secondary | ICD-10-CM | POA: Diagnosis not present

## 2019-09-07 DIAGNOSIS — M9901 Segmental and somatic dysfunction of cervical region: Secondary | ICD-10-CM | POA: Diagnosis not present

## 2019-09-07 DIAGNOSIS — M9902 Segmental and somatic dysfunction of thoracic region: Secondary | ICD-10-CM | POA: Diagnosis not present

## 2019-09-20 ENCOUNTER — Ambulatory Visit: Payer: Federal, State, Local not specified - PPO | Attending: Internal Medicine

## 2019-09-20 DIAGNOSIS — Z23 Encounter for immunization: Secondary | ICD-10-CM | POA: Insufficient documentation

## 2019-09-20 NOTE — Progress Notes (Signed)
   Covid-19 Vaccination Clinic  Name:  Rue Krabbenhoft    MRN: [id number] DOB: 04-Sep-1960  09/20/2019  Ms. Genao was observed post Covid-19 immunization for 15 minutes without incidence. She was provided with Vaccine Information Sheet and instruction to access the V-Safe system.   Ms. Mundell was instructed to call 911 with any severe reactions post vaccine: Marland Kitchen Difficulty breathing  . Swelling of your face and throat  . A fast heartbeat  . A bad rash all over your body  . Dizziness and weakness    Immunizations Administered    Name Date Dose VIS Date Route   Pfizer COVID-19 Vaccine 09/20/2019 12:57 PM 0.3 mL 07/22/2019 Intramuscular   Manufacturer: [place]   Lot: [id number]   [place]: [id number]

## 2019-09-22 ENCOUNTER — Ambulatory Visit: Payer: Self-pay

## 2019-09-27 ENCOUNTER — Telehealth: Payer: Federal, State, Local not specified - PPO | Admitting: Emergency Medicine

## 2019-09-27 DIAGNOSIS — M5442 Lumbago with sciatica, left side: Secondary | ICD-10-CM

## 2019-09-27 MED ORDER — PREDNISONE 20 MG PO TABS: ORAL_TABLET | ORAL | 0 refills | Status: DC

## 2019-09-27 MED ORDER — CYCLOBENZAPRINE HCL 10 MG PO TABS: 10.0000 mg | ORAL_TABLET | Freq: Three times a day (TID) | ORAL | 0 refills | Status: DC | PRN

## 2019-09-27 NOTE — Progress Notes (Signed)
We are sorry that you are not feeling well.  Here is how we plan to help!  Based on what you have shared with me it looks like you mostly have acute back pain.  Acute back pain is defined as musculoskeletal pain that can resolve in 1-3 weeks with conservative treatment.  I have prescribed a prednisone taper.  You can also continue non-steroid anti-inflammatory (NSAIDs), such as Ibuprofen.  I've also sent Flexeril 10 mg every eight hours as needed which is a muscle relaxer  Some patients experience stomach irritation or in increased heartburn with anti-inflammatory drugs.  Please keep in mind that muscle relaxer's can cause fatigue and should not be taken while at work or driving.  Back pain is very common.  The pain often gets better over time.  The cause of back pain is usually not dangerous.  Most people can learn to manage their back pain on their own.  Home Care  Stay active.  Start with short walks on flat ground if you can.  Try to walk farther each day.  Do not sit, drive or stand in one place for more than 30 minutes.  Do not stay in bed.  Do not avoid exercise or work.  Activity can help your back heal faster.  Be careful when you bend or lift an object.  Bend at your knees, keep the object close to you, and do not twist.  Sleep on a firm mattress.  Lie on your side, and bend your knees.  If you lie on your back, put a pillow under your knees.  Only take medicines as told by your doctor.  Put ice on the injured area.  Put ice in a plastic bag  Place a towel between your skin and the bag  Leave the ice on for 15-20 minutes, 3-4 times a day for the first 2-3 days. 210 After that, you can switch between ice and heat packs.  Ask your doctor about back exercises or massage.  Avoid feeling anxious or stressed.  Find good ways to deal with stress, such as exercise.  Get Help Right Way If:  Your pain does not go away with rest or medicine.  Your pain does not go away in 1  week.  You have new problems.  You do not feel well.  The pain spreads into your legs.  You cannot control when you poop (bowel movement) or pee (urinate)  You feel sick to your stomach (nauseous) or throw up (vomit)  You have belly (abdominal) pain.  You feel like you may pass out (faint).  If you develop a fever.  Make Sure you:  Understand these instructions.  Will watch your condition  Will get help right away if you are not doing well or get worse.  Your e-visit answers were reviewed by a board certified advanced clinical practitioner to complete your personal care plan.  Depending on the condition, your plan could have included both over the counter or prescription medications.  If there is a problem please reply  once you have received a response from your provider.  Your safety is important to [place].  If you have drug allergies check your prescription carefully.    You can use MyChart to ask questions about today's visit, request a non-urgent call back, or ask for a work or school excuse for 24 hours related to this e-Visit. If it has been greater than 24 hours you will need to follow up with your provider, or  enter a new e-Visit to address those concerns.  You will get an e-mail in the next two days asking about your experience.  I hope that your e-visit has been valuable and will speed your recovery. Thank you for using e-visits.  Greater than 5 minutes, yet less than 10 minutes was used in reviewing the patient's chart, questionnaire, prescribing medications, and documentation for this visit.

## 2019-10-15 ENCOUNTER — Ambulatory Visit: Payer: Federal, State, Local not specified - PPO

## 2019-12-12 DIAGNOSIS — N63 Unspecified lump in unspecified breast: Secondary | ICD-10-CM | POA: Diagnosis not present

## 2019-12-12 DIAGNOSIS — Z6830 Body mass index (BMI) 30.0-30.9, adult: Secondary | ICD-10-CM | POA: Diagnosis not present

## 2019-12-12 DIAGNOSIS — Z01419 Encounter for gynecological examination (general) (routine) without abnormal findings: Secondary | ICD-10-CM | POA: Diagnosis not present

## 2020-01-26 DIAGNOSIS — N6311 Unspecified lump in the right breast, upper outer quadrant: Secondary | ICD-10-CM | POA: Diagnosis not present

## 2020-01-26 DIAGNOSIS — R928 Other abnormal and inconclusive findings on diagnostic imaging of breast: Secondary | ICD-10-CM | POA: Diagnosis not present

## 2020-01-26 DIAGNOSIS — N6312 Unspecified lump in the right breast, upper inner quadrant: Secondary | ICD-10-CM | POA: Diagnosis not present

## 2020-01-31 DIAGNOSIS — K219 Gastro-esophageal reflux disease without esophagitis: Secondary | ICD-10-CM | POA: Diagnosis not present

## 2020-01-31 DIAGNOSIS — Z0000 Encounter for general adult medical examination without abnormal findings: Secondary | ICD-10-CM | POA: Diagnosis not present

## 2020-01-31 DIAGNOSIS — B0089 Other herpesviral infection: Secondary | ICD-10-CM | POA: Diagnosis not present

## 2020-01-31 DIAGNOSIS — R42 Dizziness and giddiness: Secondary | ICD-10-CM | POA: Diagnosis not present

## 2020-01-31 DIAGNOSIS — E782 Mixed hyperlipidemia: Secondary | ICD-10-CM | POA: Diagnosis not present

## 2020-02-03 DIAGNOSIS — N6310 Unspecified lump in the right breast, unspecified quadrant: Secondary | ICD-10-CM | POA: Diagnosis not present

## 2020-02-03 DIAGNOSIS — R920 Mammographic microcalcification found on diagnostic imaging of breast: Secondary | ICD-10-CM | POA: Diagnosis not present

## 2020-02-03 DIAGNOSIS — C50911 Malignant neoplasm of unspecified site of right female breast: Secondary | ICD-10-CM | POA: Diagnosis not present

## 2020-02-03 DIAGNOSIS — Z170 Estrogen receptor positive status [ER+]: Secondary | ICD-10-CM | POA: Diagnosis not present

## 2020-02-03 DIAGNOSIS — C50211 Malignant neoplasm of upper-inner quadrant of right female breast: Secondary | ICD-10-CM | POA: Diagnosis not present

## 2020-02-03 DIAGNOSIS — D0511 Intraductal carcinoma in situ of right breast: Secondary | ICD-10-CM | POA: Diagnosis not present

## 2020-02-10 DIAGNOSIS — Z170 Estrogen receptor positive status [ER+]: Secondary | ICD-10-CM | POA: Diagnosis not present

## 2020-02-10 DIAGNOSIS — C50911 Malignant neoplasm of unspecified site of right female breast: Secondary | ICD-10-CM | POA: Diagnosis not present

## 2020-02-10 DIAGNOSIS — F17200 Nicotine dependence, unspecified, uncomplicated: Secondary | ICD-10-CM | POA: Diagnosis not present

## 2020-02-15 DIAGNOSIS — Z170 Estrogen receptor positive status [ER+]: Secondary | ICD-10-CM | POA: Diagnosis not present

## 2020-02-15 DIAGNOSIS — C50411 Malignant neoplasm of upper-outer quadrant of right female breast: Secondary | ICD-10-CM | POA: Diagnosis not present

## 2020-02-21 DIAGNOSIS — Z780 Asymptomatic menopausal state: Secondary | ICD-10-CM | POA: Diagnosis not present

## 2020-02-21 DIAGNOSIS — M85852 Other specified disorders of bone density and structure, left thigh: Secondary | ICD-10-CM | POA: Diagnosis not present

## 2020-02-24 DIAGNOSIS — Z170 Estrogen receptor positive status [ER+]: Secondary | ICD-10-CM | POA: Diagnosis not present

## 2020-02-24 DIAGNOSIS — Z20822 Contact with and (suspected) exposure to covid-19: Secondary | ICD-10-CM | POA: Diagnosis not present

## 2020-02-24 DIAGNOSIS — Z01812 Encounter for preprocedural laboratory examination: Secondary | ICD-10-CM | POA: Diagnosis not present

## 2020-02-24 DIAGNOSIS — C50911 Malignant neoplasm of unspecified site of right female breast: Secondary | ICD-10-CM | POA: Diagnosis not present

## 2020-02-28 DIAGNOSIS — C50411 Malignant neoplasm of upper-outer quadrant of right female breast: Secondary | ICD-10-CM | POA: Diagnosis not present

## 2020-02-28 DIAGNOSIS — Z170 Estrogen receptor positive status [ER+]: Secondary | ICD-10-CM | POA: Diagnosis not present

## 2020-02-28 DIAGNOSIS — C50911 Malignant neoplasm of unspecified site of right female breast: Secondary | ICD-10-CM | POA: Diagnosis not present

## 2020-03-01 ENCOUNTER — Other Ambulatory Visit: Payer: Self-pay | Admitting: Hematology and Oncology

## 2020-03-01 DIAGNOSIS — C50911 Malignant neoplasm of unspecified site of right female breast: Secondary | ICD-10-CM | POA: Diagnosis not present

## 2020-03-01 DIAGNOSIS — C50411 Malignant neoplasm of upper-outer quadrant of right female breast: Secondary | ICD-10-CM | POA: Diagnosis not present

## 2020-03-01 DIAGNOSIS — Z170 Estrogen receptor positive status [ER+]: Secondary | ICD-10-CM | POA: Diagnosis not present

## 2020-03-01 DIAGNOSIS — D0511 Intraductal carcinoma in situ of right breast: Secondary | ICD-10-CM | POA: Diagnosis not present

## 2020-03-01 HISTORY — PX: BREAST LUMPECTOMY: SHX2

## 2020-03-06 ENCOUNTER — Telehealth: Payer: Federal, State, Local not specified - PPO | Admitting: Family

## 2020-03-06 DIAGNOSIS — M5441 Lumbago with sciatica, right side: Secondary | ICD-10-CM | POA: Diagnosis not present

## 2020-03-06 MED ORDER — PREDNISONE 20 MG PO TABS: ORAL_TABLET | ORAL | 0 refills | Status: DC

## 2020-03-06 NOTE — Progress Notes (Signed)
We are sorry that you are not feeling well.  Here is how we plan to help!  Based on what you have shared with me it looks like you mostly have acute back pain.  Acute back pain is defined as musculoskeletal pain that can resolve in 1-3 weeks with conservative treatment.  I have prescribed Prednisone dosepak.  Back pain is very common.  The pain often gets better over time.  The cause of back pain is usually not dangerous.  Most people can learn to manage their back pain on their own.  Home Care  Stay active.  Start with short walks on flat ground if you can.  Try to walk farther each day.  Do not sit, drive or stand in one place for more than 30 minutes.  Do not stay in bed.  Do not avoid exercise or work.  Activity can help your back heal faster.  Be careful when you bend or lift an object.  Bend at your knees, keep the object close to you, and do not twist.  Sleep on a firm mattress.  Lie on your side, and bend your knees.  If you lie on your back, put a pillow under your knees.  Only take medicines as told by your doctor.  Put ice on the injured area.  Put ice in a plastic bag  Place a towel between your skin and the bag  Leave the ice on for 15-20 minutes, 3-4 times a day for the first 2-3 days. 210 After that, you can switch between ice and heat packs.  Ask your doctor about back exercises or massage.  Avoid feeling anxious or stressed.  Find good ways to deal with stress, such as exercise.  Get Help Right Way If:  Your pain does not go away with rest or medicine.  Your pain does not go away in 1 week.  You have new problems.  You do not feel well.  The pain spreads into your legs.  You cannot control when you poop (bowel movement) or pee (urinate)  You feel sick to your stomach (nauseous) or throw up (vomit)  You have belly (abdominal) pain.  You feel like you may pass out (faint).  If you develop a fever.  Make Sure you:  Understand these  instructions.  Will watch your condition  Will get help right away if you are not doing well or get worse.  Your e-visit answers were reviewed by a board certified advanced clinical practitioner to complete your personal care plan.  Depending on the condition, your plan could have included both over the counter or prescription medications.  If there is a problem please reply  once you have received a response from your provider.  Your safety is important to [place].  If you have drug allergies check your prescription carefully.    You can use MyChart to ask questions about today's visit, request a non-urgent call back, or ask for a work or school excuse for 24 hours related to this e-Visit. If it has been greater than 24 hours you will need to follow up with your provider, or enter a new e-Visit to address those concerns.  You will get an e-mail in the next two days asking about your experience.  I hope that your e-visit has been valuable and will speed your recovery. Thank you for using e-visits.   Greater than 5 minutes, yet less than 10 minutes of time have been spent researching, coordinating, and implementing care  for this patient today.  Thank you for the details you included in the comment boxes. Those details are very helpful in determining the best course of treatment for you and help [place] to provide the best care.

## 2020-03-28 ENCOUNTER — Telehealth: Payer: Self-pay | Admitting: Hematology and Oncology

## 2020-03-28 NOTE — Telephone Encounter (Signed)
Received a call from Kari Rowland to schedule a new pt appt w/Dr. Lindi Adie for a dx of breast cancer. She was originally being seen at [facility] but wanted to transfer her care. She has been scheduled to see Dr. Lindi Adie on 8/24 at 1pm. Pt aware toa rrive 15 minutes early.

## 2020-04-02 DIAGNOSIS — I898 Other specified noninfective disorders of lymphatic vessels and lymph nodes: Secondary | ICD-10-CM | POA: Diagnosis not present

## 2020-04-02 DIAGNOSIS — M25511 Pain in right shoulder: Secondary | ICD-10-CM | POA: Diagnosis not present

## 2020-04-02 NOTE — Progress Notes (Signed)
[facility] CONSULT NOTE  Patient Care Team: Panosh, Standley Brooking, MD as PCP - General  CHIEF COMPLAINTS/PURPOSE OF CONSULTATION:  Newly diagnosed breast cancer  HISTORY OF PRESENTING ILLNESS:  Kari Rowland 59 y.o. female is here because of recent diagnosis of invasive ductal carcinoma of the right breast. Patient palpated a right beast lump for several months. Mammogram on 01/26/20 showed a 1.2cm mass in the right breast at the 1 o'clock position, a 0.4cm mass at the 12 o'clock position, and no evidence of abnormal axillary adenopathy. 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 She has a history of saline breast implants. She was originally under treatment at [facility] with Dr. Leonard Downing, but wants to transfer her care. She presents to the clinic today for initial evaluation and discussion of treatment options.   I reviewed her records extensively and collaborated the history with the patient.  SUMMARY OF ONCOLOGIC HISTORY: Oncology History  Malignant neoplasm of upper-inner quadrant of right breast in female, estrogen receptor positive ([place])  02/03/2020 Initial Diagnosis   Patient palpated a right beast lump for several months. Mammogram on 01/26/20 showed a 1.2cm mass in the right breast at the 1 o'clock position, a 0.4cm mass at the 12 o'clock position, and no evidence of abnormal axillary adenopathy. 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 She has a history of saline breast implants.     MEDICAL HISTORY:  Past Medical History:  Diagnosis Date  . Adjustment disorder with anxious mood 04/22/2012  . Allergy   . AMENORRHEA, SECONDARY 09/20/2010   Qualifier: Diagnosis of  By: Esmeralda Arthur    . Atypical chest pain   . Depression   . GERD (gastroesophageal reflux disease)   . Hep A w/o coma   .  HYPERCHOLESTEROLEMIA 09/20/2010   Qualifier: Diagnosis of  By: Esmeralda Arthur    . MENOPAUSE-RELATED VASOMOTOR SYMPTOMS 12/11/2009   Qualifier: Diagnosis of  By: Regis Bill MD, Standley Brooking   . Phlebitis   . TOBACCO USE 08/20/2007   Qualifier: Diagnosis of  By: Regis Bill MD, Standley Brooking   . [place]     SURGICAL HISTORY: Past Surgical History:  Procedure Laterality Date  . ABDOMINAL HYSTERECTOMY    . BREAST SURGERY    . CERVICAL FUSION      SOCIAL HISTORY: Social History   Socioeconomic History  . Marital status: Married    Spouse name: Not on file  . Number of children: Not on file  . Years of education: Not on file  . Highest education level: Not on file  Occupational History  . Not on file  Tobacco Use  . Smoking status: Current Every Day Smoker    Packs/day: 1.00    Years: 15.00    Pack years: 15.00    Types: Cigarettes  . Smokeless tobacco: Never Used  Substance and Sexual Activity  . Alcohol use: Yes    Comment: wine intermittently  . Drug use: No  . Sexual activity: Yes    Comment: adjudicator, RN [employer], single, engaged, household of 5 some caffeine.  Other Topics Concern  . Not on file  Social History Narrative  . Not on file   Social Determinants of Health   Financial Resource Strain:   . Difficulty of Paying Living Expenses: Not on file  Food Insecurity:   . Worried  About Running Out of Food in the Last Year: Not on file  . Ran Out of Food in the Last Year: Not on file  Transportation Needs:   . Lack of Transportation (Medical): Not on file  . Lack of Transportation (Non-Medical): Not on file  Physical Activity:   . Days of Exercise per Week: Not on file  . Minutes of Exercise per Session: Not on file  Stress:   . Feeling of Stress : Not on file  Social Connections:   . Frequency of Communication with Friends and Family: Not on file  . Frequency of Social Gatherings with Friends and Family: Not on file  . Attends Religious Services: Not on file  .  Active Member of Clubs or Organizations: Not on file  . Attends Archivist Meetings: Not on file  . Marital Status: Not on file  Intimate Partner Violence:   . Fear of Current or Ex-Partner: Not on file  . Emotionally Abused: Not on file  . Physically Abused: Not on file  . Sexually Abused: Not on file    FAMILY HISTORY: Family History  Problem Relation Age of Onset  . Heart disease Unknown        CAD  . Thyroid disease Mother   . Hypertension Mother   . Scleroderma Unknown   . Diabetes Father     ALLERGIES:  is allergic to procaine, sulfa antibiotics, azithromycin, erythromycin, morphine, other, rosuvastatin, sulfonamide derivatives, tetracycline, and ciprofloxacin.  MEDICATIONS:  Current Outpatient Medications  Medication Sig Dispense Refill  . cholecalciferol (VITAMIN D) 400 UNITS TABS Take 1,000 Units by mouth daily.      . cyclobenzaprine (FLEXERIL) 10 MG tablet Take 1 tablet (10 mg total) by mouth 3 (three) times daily as needed for muscle spasms. 10 tablet 0  . diazepam (VALIUM) 5 MG tablet 1/2 to 1 tab po tid prn vertigo 20 tablet 0  . nabumetone (RELAFEN) 750 MG tablet Take 1 tablet (750 mg total) by mouth 2 (two) times daily as needed. 60 tablet 6  . omeprazole (PRILOSEC) 40 MG capsule Take 1 capsule (40 mg total) by mouth daily. 30 capsule 6  . predniSONE (DELTASONE) 20 MG tablet Take 40 mg by mouth daily for 3 days, then 29m by mouth daily for 3 days, then 156mdaily for 3 days 12 tablet 0  . tiZANidine (ZANAFLEX) 2 MG tablet Take 1-2 tablets (2-4 mg total) by mouth every 6 (six) hours as needed for muscle spasms. 60 tablet 1  . vitamin B-12 (CYANOCOBALAMIN) 1000 MCG tablet 1,000 mcg daily.     No current facility-administered medications for this visit.    REVIEW OF SYSTEMS:   Breast: Palpable right breast lump All other systems were reviewed with the patient and are negative.  PHYSICAL EXAMINATION: ECOG PERFORMANCE STATUS: 1 - Symptomatic but  completely ambulatory  There were no vitals filed for this visit. There were no vitals filed for this visit.   LABORATORY DATA:  I have reviewed the data as listed Lab Results  Component Value Date   WBC 5.4 08/14/2008   HGB 14.4 08/14/2008   HCT 41.1 08/14/2008   MCV 94.0 08/14/2008   PLT 198 08/14/2008   Lab Results  Component Value Date   NA 144 12/11/2009   K 4.8 12/11/2009   CL 106 12/11/2009   CO2 31 12/11/2009    RADIOGRAPHIC STUDIES: I have personally reviewed the radiological reports and agreed with the findings in the report.  ASSESSMENT AND  PLAN:  Malignant neoplasm of upper-inner quadrant of right breast in female, estrogen receptor positive ([place]) 02/03/2020:Patient palpated a right beast lump for several months. Mammogram on 01/26/20 showed a 1.2cm mass in the right breast at the 1 o'clock position, a 0.4cm mass at the 12 o'clock position, and no evidence of abnormal axillary adenopathy. 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 She has a history of saline breast implants. Genetic testing: Negative  03/09/2020: Right lumpectomy (Dr. Genia Hotter) : IDC 1.5 cm, grade 2, margins negative, 0/3 lymph nodes negative, right second lumpectomy: Grade 2 IDC 7 mm, margins negative, ER 90%, PR 90%, HER-2 negative, Ki-67 20% Oncotype DX recurrence score 11, distant recurrence at 9 years: 3%, no chemotherapy benefit  Second Oncotype has been ordered on the second tumor.  We are awaiting that result.  Recommendations: 1. Adjuvant radiation therapy followed by 2. Adjuvant antiestrogen therapy  Patient would like [place] to have a pathology review of her specimen from [facility]. She is transferring her care from [facility] because she was frustrated with the communication issues.  All questions were answered. The patient knows to call the clinic with any problems, questions or concerns.   Rulon Eisenmenger, MD,  MPH 04/03/2020    I, Molly Dorshimer, am acting as scribe for Nicholas Lose, MD.  I have reviewed the above documentation for accuracy and completeness, and I agree with the above.

## 2020-04-03 ENCOUNTER — Inpatient Hospital Stay
Payer: Federal, State, Local not specified - PPO | Attending: Hematology and Oncology | Admitting: Hematology and Oncology

## 2020-04-03 ENCOUNTER — Other Ambulatory Visit: Payer: Self-pay

## 2020-04-03 ENCOUNTER — Encounter: Payer: Self-pay | Admitting: Hematology and Oncology

## 2020-04-03 DIAGNOSIS — Z170 Estrogen receptor positive status [ER+]: Secondary | ICD-10-CM | POA: Diagnosis not present

## 2020-04-03 DIAGNOSIS — C50211 Malignant neoplasm of upper-inner quadrant of right female breast: Secondary | ICD-10-CM | POA: Insufficient documentation

## 2020-04-03 DIAGNOSIS — F17210 Nicotine dependence, cigarettes, uncomplicated: Secondary | ICD-10-CM | POA: Diagnosis not present

## 2020-04-03 DIAGNOSIS — Z90710 Acquired absence of both cervix and uterus: Secondary | ICD-10-CM | POA: Insufficient documentation

## 2020-04-03 NOTE — Progress Notes (Signed)
Sent release of information to both [facility] and [facility] to receive report for Oncotype as well as pathology reports and material, faxed to [phone] (oncotype) [phone]), confirmation received on both. Also informed Varney Biles in pathology to look out for material

## 2020-04-03 NOTE — Assessment & Plan Note (Signed)
02/03/2020:Patient palpated a right beast lump for several months. Mammogram on 01/26/20 showed a 1.2cm mass in the right breast at the 1 o'clock position, a 0.4cm mass at the 12 o'clock position, and no evidence of abnormal axillary adenopathy. 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 She has a history of saline breast implants.  Pathology and radiology counseling:Discussed with the patient, the details of pathology including the type of breast cancer,the clinical staging, the significance of ER, PR and HER-2/neu receptors and the implications for treatment. After reviewing the pathology in detail, we proceeded to discuss the different treatment options between surgery, radiation, chemotherapy, antiestrogen therapies.  Recommendations: 1. Breast conserving surgery followed by 2. Oncotype DX testing to determine if chemotherapy would be of any benefit followed by 3. Adjuvant radiation therapy followed by 4. Adjuvant antiestrogen therapy  Oncotype counseling: I discussed Oncotype DX test. I explained to the patient that this is a 21 gene panel to evaluate patient tumors DNA to calculate recurrence score. This would help determine whether patient has high risk or intermediate risk or low risk breast cancer. She understands that if her tumor was found to be high risk, she would benefit from systemic chemotherapy. If low risk, no need of chemotherapy. If she was found to be intermediate risk, we would need to evaluate the score as well as other risk factors and determine if an abbreviated chemotherapy may be of benefit.  Return to clinic after surgery to discuss final pathology report and then determine if Oncotype DX testing will need to be sent.

## 2020-04-04 ENCOUNTER — Telehealth: Payer: Self-pay | Admitting: Hematology and Oncology

## 2020-04-04 ENCOUNTER — Other Ambulatory Visit: Payer: Self-pay | Admitting: Hematology and Oncology

## 2020-04-04 DIAGNOSIS — Z170 Estrogen receptor positive status [ER+]: Secondary | ICD-10-CM

## 2020-04-04 NOTE — Progress Notes (Signed)
Ambulatory referral to physical therapy

## 2020-04-04 NOTE — Telephone Encounter (Signed)
No 8/24 los. No changes made to pt's schedule.  

## 2020-04-05 ENCOUNTER — Other Ambulatory Visit: Payer: Self-pay | Admitting: *Deleted

## 2020-04-05 ENCOUNTER — Encounter: Payer: Self-pay | Admitting: *Deleted

## 2020-04-05 ENCOUNTER — Telehealth: Payer: Self-pay | Admitting: *Deleted

## 2020-04-05 DIAGNOSIS — Z170 Estrogen receptor positive status [ER+]: Secondary | ICD-10-CM

## 2020-04-05 NOTE — Telephone Encounter (Signed)
Called pt and provided contact information as well as navigation resources. Discussed referral to radiation oncology as well as PT referral and discussions with [facility] on status update on 2nd tumor testing for oncotype score.  Denies further needs or questions at this time.

## 2020-04-09 ENCOUNTER — Encounter: Payer: Self-pay | Admitting: Rehabilitation

## 2020-04-09 ENCOUNTER — Encounter: Payer: Self-pay | Admitting: *Deleted

## 2020-04-09 ENCOUNTER — Ambulatory Visit: Payer: Federal, State, Local not specified - PPO | Attending: Hematology and Oncology | Admitting: Rehabilitation

## 2020-04-09 ENCOUNTER — Other Ambulatory Visit: Payer: Self-pay

## 2020-04-09 DIAGNOSIS — Z483 Aftercare following surgery for neoplasm: Secondary | ICD-10-CM | POA: Insufficient documentation

## 2020-04-09 DIAGNOSIS — R600 Localized edema: Secondary | ICD-10-CM | POA: Diagnosis not present

## 2020-04-09 DIAGNOSIS — M25611 Stiffness of right shoulder, not elsewhere classified: Secondary | ICD-10-CM | POA: Insufficient documentation

## 2020-04-09 NOTE — Therapy (Signed)
[place], [place], [address] Phone: [phone]   Fax:  [phone]  Physical Therapy Evaluation  Patient Details  Name: Kari Rowland MRN: [id number] Date of Birth: 1960/10/07 Referring Provider (PT): Dr. Lindi Adie   Encounter Date: 04/09/2020   PT End of Session - 04/09/20 2045    Visit Number 1    Number of Visits 6    Date for PT Re-Evaluation 05/21/20    PT Start Time 1100    PT Stop Time 1152    PT Time Calculation (min) 52 min    Activity Tolerance Patient tolerated treatment well    Behavior During Therapy [facility] for tasks assessed/performed           Past Medical History:  Diagnosis Date   Adjustment disorder with anxious mood 04/22/2012   Allergy    AMENORRHEA, SECONDARY 09/20/2010   Qualifier: Diagnosis of  By: Esmeralda Arthur     Atypical chest pain    Depression    GERD (gastroesophageal reflux disease)    Hep A w/o coma    HYPERCHOLESTEROLEMIA 09/20/2010   Qualifier: Diagnosis of  By: Esmeralda Arthur     MENOPAUSE-RELATED VASOMOTOR SYMPTOMS 12/11/2009   Qualifier: Diagnosis of  By: Regis Bill MD, Standley Brooking    Phlebitis    TOBACCO USE 08/20/2007   Qualifier: Diagnosis of  By: Regis Bill MD, Brandy Hale     Past Surgical History:  Procedure Laterality Date   ABDOMINAL HYSTERECTOMY     BREAST SURGERY     CERVICAL FUSION      There were no vitals filed for this visit.    Subjective Assessment - 04/09/20 1101    Subjective I have some of the that cording in the Rt armpit.  I had one therapy appointment but it hurt so bad Dr. Lindi Adie wanted me to come here. It may be swollen in the Rt arm and breast    Pertinent History Rt breast IDC with DCIS ER/PR positive, HER2 negative post Rt lumpectomy on 03/01/20 with Dr. Genia Hotter 0/3 lymph nodes positive with a huge hematoma located on the whole breast delaying surgery.   No chemotherapy needed.  Radiation consultation tomorrow.   Other history includes: depression, anxiety/adjustment disorder high cholesterol    Limitations --   none   Patient Stated Goals work on this cording    Currently in Pain? No/denies   I can feel the pull when I really extend             [facility] PT Assessment - 04/09/20 0001      Assessment   Medical Diagnosis Rt breast cancer    Referring Provider (PT) Dr. Lindi Adie    Onset Date/Surgical Date 03/01/20    Hand Dominance Right    Prior Therapy one visit another location      Precautions   Precaution Comments lymphedema precaution      Restrictions   Weight Bearing Restrictions No      Balance Screen   Has the patient fallen in the past 6 months No    Has the patient had a decrease in activity level because of a fear of falling?  No    Is the patient reluctant to leave their home because of a fear of falling?  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     Leisure walking       Cognition   Overall Cognitive Status Within Functional Limits for tasks assessed      Observation/Other Assessments   Observations 1 cord evident in axilla with arm in flexion and horizontal abduction, some soft edema lateral breast, well healed incisions    Skin Integrity some fibrosis superior to nipple incision, axillary incision feels WNL with some general edema underneath      Sensation   Additional Comments no numbness remains      Coordination   Gross Motor Movements are Fluid and Coordinated Yes      Posture/Postural Control   Posture/Postural Control Postural limitations    Postural Limitations Rounded Shoulders;Forward head      ROM / Strength   AROM / PROM / Strength AROM      AROM   Overall AROM Comments Lt RCR and bicep tear leaving weakness    AROM  Assessment Site Shoulder    Right/Left Shoulder Right;Left    Right Shoulder Extension 55 Degrees    Right Shoulder Flexion 165 Degrees   pull   Right Shoulder ABduction 165 Degrees    Right Shoulder Internal Rotation 60 Degrees    Right Shoulder External Rotation 100 Degrees    Right Shoulder Horizontal ABduction 30 Degrees      Palpation   Palpation comment cord from axilla to halfway down upper arm             LYMPHEDEMA/ONCOLOGY QUESTIONNAIRE - 04/09/20 0001      Surgeries   Lumpectomy Date 03/01/20    Number Lymph Nodes Removed 3      Treatment   Active Chemotherapy Treatment No    Past Chemotherapy Treatment No    Active Radiation Treatment No    Past Radiation Treatment No    Current Hormone Treatment No    Past Hormone Therapy No      Lymphedema Assessments   Lymphedema Assessments Upper extremities                 Quick Dash - 04/09/20 0001    Open a tight or new jar No difficulty    Do heavy household chores (wash walls, wash floors) No difficulty    Carry a shopping bag or briefcase No difficulty    Wash your back No difficulty    Use a knife to cut food No difficulty    Recreational activities in which you take some force or impact through your arm, shoulder, or hand (golf, hammering, tennis) No difficulty    During the past week, to what extent has your arm, shoulder or hand problem interfered with your normal social activities with family, friends, neighbors, or groups? Not at all    During the past week, to what extent has your arm, shoulder or hand problem limited your work or other regular daily activities Not at all    Arm, shoulder, or hand pain. Mild    Tingling (pins and needles) in your arm, shoulder, or hand Mild    Difficulty Sleeping Mild difficulty    DASH Score 6.82 %            Objective measurements completed on examination: See above findings.       [facility] Adult PT Treatment/Exercise - 04/09/20 0001      Self-Care    Self-Care Scar  Mobilizations    Scar Mobilizations education on scar massage and handout given      Exercises   Exercises Shoulder;Other Exercises    Other Exercises  education on initial HEP with performance of each x 1      Manual Therapy   Manual Therapy Passive ROM;Manual Lymphatic Drainage (MLD);Myofascial release;Edema management    Manual therapy comments scar release to Rt lumpectomy incision    Edema Management gave pt 1/2" gray foam for lateral edema and recommended pt wear compression bra which pt reports to have    Myofascial Release to the Rt axilla and cording with PROM as able to sretch cord    Manual Lymphatic Drainage (MLD) axillary and lateral breast stationary circles towards Rt axilla    Passive ROM of the Rt shoulder                  PT Education - 04/09/20 2045    Education Details cording, POC, scar massage, initial HEP    Person(s) Educated Patient    Methods Explanation;Demonstration;Tactile cues;Verbal cues;Handout    Comprehension Verbalized understanding;Returned demonstration;Verbal cues required;Tactile cues required;Need further instruction               PT Long Term Goals - 04/09/20 2051      PT LONG TERM GOAL #1   Title Pt will improve Rt shoulder flexion to full without axillary pulling    Time 6    Period Weeks    Status New      PT LONG TERM GOAL #2   Title Pt will report decrease in breast discomfort/heaviness by at least 50%    Baseline starting to feel uncomfortable    Time 6    Period Weeks    Status New      PT LONG TERM GOAL #3   Title Pt will be ind with risk reduction information regarding lymphedema and set up SOZO surveillance if warranted    Time 6    Period Weeks    Status New      PT LONG TERM GOAL #4   Title Pt will be ind with final HEP    Time 6    Period Weeks    Status New                  Plan - 04/09/20 2046    Clinical Impression Statement Pt presents almost 6 weeks post Rt lumpectomy  and SLNB with excellent ROM except for the presence of one axillary cord to the mid upper arm most noticeable in flexion end ROM.  Pt also with some fibrosis/scar tissue around lumpectomy incision and possible lateral breast/whole breast edema.  Improved ROM and less tautness to the cording after initial brief session of MT today which encouraged pt.  Will continue PT at this time to maximize ROM and strength    Personal Factors and Comorbidities Comorbidity 2    Comorbidities SLNB, radiation starting    Examination-Activity Limitations Reach Overhead    Stability/Clinical Decision Making Stable/Uncomplicated    Clinical Decision Making Low    Rehab Potential Excellent    PT Frequency 1x / week    PT Duration 6 weeks    PT Treatment/Interventions ADLs/Self Care Home Management;Therapeutic exercise;Taping;Passive range of motion;Manual techniques;Manual lymph drainage;Compression bandaging    PT Next Visit Plan measure circumferences, SOZO?, give TG soft or recommend sleeve for cording?, visual inspection seated on severity of breast edema (observed in supine today during MT), Rt release of  cording / scar tissue work,/ MLD as needed    PT Home Exercise Plan [email or web address]    Recommended Other Services bra    Consulted and Agree with Plan of Care Patient           Patient will benefit from skilled therapeutic intervention in order to improve the following deficits and impairments:  Decreased range of motion, Increased fascial restricitons, Impaired UE functional use, Pain  Visit Diagnosis: Stiffness of right shoulder, not elsewhere classified  Aftercare following surgery for neoplasm  Localized edema     Problem List Patient Active Problem List   Diagnosis Date Noted   Malignant neoplasm of upper-inner quadrant of right breast in female, estrogen receptor positive ([place]) 04/03/2020   Bilateral L4-L5 Lumbar radiculitis 10/22/2012   Adjustment disorder with anxious mood 04/22/2012    BPPV (benign paroxysmal positional vertigo) 03/21/2011   HYPERCHOLESTEROLEMIA 09/20/2010   AMENORRHEA, SECONDARY 09/20/2010   HERPETIC WHITLOW 12/11/2009   MENOPAUSE-RELATED VASOMOTOR SYMPTOMS 12/11/2009   MICROSCOPIC HEMATURIA 08/21/2008   UNSPECIFIED VITAMIN D DEFICIENCY 09/13/2007   GOITER, UNSPECIFIED 08/20/2007   TOBACCO USE 08/20/2007   ALLERGIC RHINITIS 08/20/2007   Trochanteric bursitis of both hips 08/20/2007    Stark Bray 04/09/2020, 8:55 PM  [place], [place], [address] Phone: [phone]   Fax:  [phone]  Name: Teonna Coonan MRN: [id number] Date of Birth: 20-May-1961

## 2020-04-09 NOTE — Patient Instructions (Addendum)
Scar Massage  Scar massage is done to improve the mobility of scar, decrease scar tissue from building up, reduce adhesions, and prevent Keloids from forming. Start scar massage after scabs have fallen off by themselves and no open areas. The first few weeks after surgery, it is normal for a scar to appear pink or red and slightly raised. Scars can itch or have areas of numbness. Some scars may be sensitive.   Direct Scar massage: after scar is healed, no opening, no scab 1.  Place pads of two fingers together directly on the scar starting at one end of the scar. Move the fingers up and down across the scar holding 5 seconds one direction.  Then go opposite direction hold 5 seconds.  2. Move over to the next section of the scar and repeat.  Work your way along the entire length of the scar.   3. Next make diagonal movements along the scar holding 5 seconds at one direction. 4. Next movement is side to side. 5. Do not rub fingers over the scar.  Instead keep firm pressure and move scar over the tissue it is on top   Scar Lift and Roll 12 weeks after surgery. 1. Pinch a small amount of the scar between your first two fingers and thumb.  2. Roll the scar between your fingers for 5 to 15 seconds. 3. Move along the scar and repeat until you have massaged the entire length of scar.   Stop the massage and call your doctor if you notice: 1. Increased redness 2. Bleeding from scar 3. Seepage coming from the scar 4. Scar is warmer and has increased pain    Access Code: [email or web address]: [email or web address]: 08/30/2021Prepared by: Marcene Brawn TevisExercises  Doorway Pec Stretch at 90 Degrees Abduction - 1 x daily - 7 x weekly - 1 sets - 3 reps - 20-30 seconds hold  Median Nerve Tensioner - 1 x daily - 7 x weekly - 1 sets - 10 reps - no hold  Hooklying Shoulder Y - 1-2 x daily - 7 x weekly - 1 sets - 10 reps - 2-3 second hold  Sidelying Thoracic Rotation with Open Book - 1 x daily - 7 x  weekly - 1 sets - 10 reps - 2-3 second hold

## 2020-04-09 NOTE — Progress Notes (Signed)
Location of Breast Cancer: Malignant neoplasm of upper inner quadrant of Right Breast  Did patient present with symptoms (if so, please note symptoms) or was this found on screening mammography?: Patient palpated a right breast lump for several months.  Mammogram 01/26/2020: 1.2 cm mass in the right breast at the 1 o'clock position, a 0.4 cm mass at the 12 o'clock position, and no evidence of abnormal axillary adenopathy.  Histology per Pathology Report: Right Breast Lumpectomy [facility]    Receptor Status: ER(+ 90%), PR (+ 90%), Her2-neu (-), Ki-67(20%)    Past/Anticipated interventions by surgeon, if any: Dr. Natividad Brood -Lumpectomy [facility] 03/01/2020   Past/Anticipated interventions by medical oncology, if any: Chemotherapy  Dr. Lindi Adie 04/03/2020 -Recommendations: 1. Breast conserving surgery followed by 2. Oncotype DX testing to determine if chemotherapy would be of any benefit followed by 3. Adjuvant radiation therapy followed by 4. Adjuvant antiestrogen therapy -Return to clinic after surgery to discuss final pathology report and then determine if Oncotype DX testing will need to be sent.  Lymphedema issues, if any: Has cording under her arm, swelling.  Working with PT.  Pain issues, if any: No  SAFETY ISSUES:  Prior radiation? no  Pacemaker/ICD? No  Possible current pregnancy? Hysterectomy  Is the patient on methotrexate? No  Current Complaints / other details:      Cori Razor, RN 04/09/2020,2:22 PM

## 2020-04-10 ENCOUNTER — Other Ambulatory Visit: Payer: Self-pay

## 2020-04-10 ENCOUNTER — Encounter: Payer: Self-pay | Admitting: Radiation Oncology

## 2020-04-10 ENCOUNTER — Ambulatory Visit
Admission: RE | Admit: 2020-04-10 | Discharge: 2020-04-10 | Disposition: A | Discharge status: Home / Self Care | Payer: Federal, State, Local not specified - PPO | Source: Ambulatory Visit | Attending: Radiation Oncology | Admitting: Radiation Oncology

## 2020-04-10 VITALS — BP 126/76 | HR 83 | Temp 98.4°F | Resp 18 | Ht 65.0 in | Wt 180.6 lb

## 2020-04-10 DIAGNOSIS — Z79899 Other long term (current) drug therapy: Secondary | ICD-10-CM | POA: Insufficient documentation

## 2020-04-10 DIAGNOSIS — Z170 Estrogen receptor positive status [ER+]: Secondary | ICD-10-CM | POA: Diagnosis not present

## 2020-04-10 DIAGNOSIS — E7800 Pure hypercholesterolemia, unspecified: Secondary | ICD-10-CM | POA: Diagnosis not present

## 2020-04-10 DIAGNOSIS — F17210 Nicotine dependence, cigarettes, uncomplicated: Secondary | ICD-10-CM | POA: Insufficient documentation

## 2020-04-10 DIAGNOSIS — C50211 Malignant neoplasm of upper-inner quadrant of right female breast: Secondary | ICD-10-CM | POA: Diagnosis not present

## 2020-04-10 DIAGNOSIS — Z9889 Other specified postprocedural states: Secondary | ICD-10-CM | POA: Diagnosis not present

## 2020-04-10 DIAGNOSIS — K219 Gastro-esophageal reflux disease without esophagitis: Secondary | ICD-10-CM | POA: Insufficient documentation

## 2020-04-10 NOTE — Progress Notes (Signed)
Radiation Oncology         [phone]) [phone] ________________________________  Name: Kari Rowland        MRN: [id number]  Date of Service: 04/10/2020 DOB: 04/06/1961  WJ:XBJYNW, Standley Brooking, MD  Panosh, Standley Brooking, MD     REFERRING PHYSICIAN: Regis Bill Standley Brooking, MD   DIAGNOSIS: The encounter diagnosis was Malignant neoplasm of upper-inner quadrant of right breast in female, estrogen receptor positive ([place]).   HISTORY OF PRESENT ILLNESS: Kari Rowland is a 59 y.o. female with a newly diagnosed breast cancer in the right breast.  The patient palpated a mass in the right breast for several months and was evaluated with diagnostic imaging at [facility].  This revealed a 1.2 cm mass in the right breast, at 1 o'clock position.  There was also a separate lesion that measured 4 mm at 12:00, she did not have any visible abnormalities of adenopathy in the right axilla.  A biopsy on 02/03/2019 one of the larger mass revealed invasive ductal carcinoma with associated DCIS.  The invasive component was a grade 2.  Her tumor was ER/PR positive, HER-2 negative, with a Ki-67 of 10%.  She subsequently underwent a right breast lumpectomy on 03/09/2020. The larger tumor was measured at 1.5 cm and was an invasive ductal carcinoma, grade 2, her margins were negative, and 3 sampled lymph nodes were negative.  The second lumpectomy specimen was also consistent with a grade 2 invasive ductal carcinoma, this measured 7 mm and margins were also negative.  Her tumors were ER/PR positive, HER-2 negative with a Ki-67 of 20%.  Both of her tumors were sent for separate Oncotype scoring, apparently this was delayed due to a mixup in the lab, both scores were low risk, 11 and 9 respectively with no anticipated role for chemotherapy.  PREVIOUS RADIATION THERAPY: No   PAST MEDICAL HISTORY:  Past Medical History:  Diagnosis Date  . Adjustment disorder with anxious mood 04/22/2012  . Allergy   . AMENORRHEA,  SECONDARY 09/20/2010   Qualifier: Diagnosis of  By: Esmeralda Arthur    . Atypical chest pain   . Depression   . GERD (gastroesophageal reflux disease)   . Hep A w/o coma   . HYPERCHOLESTEROLEMIA 09/20/2010   Qualifier: Diagnosis of  By: Esmeralda Arthur    . MENOPAUSE-RELATED VASOMOTOR SYMPTOMS 12/11/2009   Qualifier: Diagnosis of  By: Regis Bill MD, Standley Brooking   . Phlebitis   . TOBACCO USE 08/20/2007   Qualifier: Diagnosis of  By: Regis Bill MD, Standley Brooking   . [place]        PAST SURGICAL HISTORY: Past Surgical History:  Procedure Laterality Date  . ABDOMINAL HYSTERECTOMY    . BREAST LUMPECTOMY Right 03/01/2020  . BREAST SURGERY    . CERVICAL FUSION       FAMILY HISTORY:  Family History  Problem Relation Age of Onset  . Heart disease Other        CAD  . Thyroid disease Mother   . Hypertension Mother   . Scleroderma Other   . Diabetes Father      SOCIAL HISTORY:  reports that she has been smoking cigarettes. She has a 15.00 pack-year smoking history. She has never used smokeless tobacco. She reports current alcohol use. She reports that she does not use drugs.   ALLERGIES: Procaine, Sulfa antibiotics, Azithromycin, Erythromycin, Morphine, Other, Rosuvastatin, Sulfonamide derivatives, Tetracycline, and Ciprofloxacin   MEDICATIONS:  Current Outpatient Medications  Medication Sig Dispense Refill  .  atorvastatin (LIPITOR) 20 MG tablet Take 20 mg by mouth daily.    . cholecalciferol (VITAMIN D) 400 UNITS TABS Take 1,000 Units by mouth daily.      . vitamin B-12 (CYANOCOBALAMIN) 1000 MCG tablet 1,000 mcg daily.    . cyclobenzaprine (FLEXERIL) 10 MG tablet Take 1 tablet (10 mg total) by mouth 3 (three) times daily as needed for muscle spasms. 10 tablet 0  . diazepam (VALIUM) 5 MG tablet 1/2 to 1 tab po tid prn vertigo (Patient not taking: Reported on 04/10/2020) 20 tablet 0  . nabumetone (RELAFEN) 750 MG tablet Take 1 tablet (750 mg total) by mouth 2 (two) times daily as needed. 60 tablet 6    . omeprazole (PRILOSEC) 40 MG capsule Take 1 capsule (40 mg total) by mouth daily. (Patient not taking: Reported on 04/10/2020) 30 capsule 6  . predniSONE (DELTASONE) 20 MG tablet Take 40 mg by mouth daily for 3 days, then $RemoveBe'20mg'XtZUaaqFS$  by mouth daily for 3 days, then $RemoveBe'10mg'tWdxIcutf$  daily for 3 days 12 tablet 0  . tiZANidine (ZANAFLEX) 2 MG tablet Take 1-2 tablets (2-4 mg total) by mouth every 6 (six) hours as needed for muscle spasms. 60 tablet 1   No current facility-administered medications for this encounter.     REVIEW OF SYSTEMS: On review of systems, the patient reports that she is doing well overall. She denies any concerns with her postoperative course, and feels that she is well-healed at this time.  No other specific complaints related to her breast are noted.     PHYSICAL EXAM:  Wt Readings from Last 3 Encounters:  04/10/20 180 lb 9.6 oz (81.9 kg)  04/03/20 180 lb (81.6 kg)  10/22/12 177 lb (80.3 kg)   Temp Readings from Last 3 Encounters:  04/10/20 98.4 F (36.9 C)  04/03/20 97.6 F (36.4 C) (Tympanic)  07/30/12 98.7 F (37.1 C) (Oral)   BP Readings from Last 3 Encounters:  04/10/20 126/76  04/03/20 136/70  10/22/12 (!) 142/80   Pulse Readings from Last 3 Encounters:  04/10/20 83  04/03/20 86  10/22/12 83    In general this is a well appearing caucasian female in no acute distress. She's alert and oriented x4 and appropriate throughout the examination. Cardiopulmonary assessment is negative for acute distress and she exhibits normal effort. Bilateral breast exam is deferred.    ECOG = 0  0 - Asymptomatic (Fully active, able to carry on all predisease activities without restriction)  1 - Symptomatic but completely ambulatory (Restricted in physically strenuous activity but ambulatory and able to carry out work of a light or sedentary nature. For example, light housework, office work)  2 - Symptomatic, <50% in bed during the day (Ambulatory and capable of all self care but  unable to carry out any work activities. Up and about more than 50% of waking hours)  3 - Symptomatic, >50% in bed, but not bedbound (Capable of only limited self-care, confined to bed or chair 50% or more of waking hours)  4 - Bedbound (Completely disabled. Cannot carry on any self-care. Totally confined to bed or chair)  5 - Death   Eustace Pen MM, Creech RH, Tormey DC, et al. [phone]). "Toxicity and response criteria of the [facility]". [place] Oncol. 5 (6): 649-55    LABORATORY DATA:  Lab Results  Component Value Date   WBC 5.4 08/14/2008   HGB 14.4 08/14/2008   HCT 41.1 08/14/2008   MCV 94.0 08/14/2008   PLT  198 08/14/2008   Lab Results  Component Value Date   NA 144 12/11/2009   K 4.8 12/11/2009   CL 106 12/11/2009   CO2 31 12/11/2009   Lab Results  Component Value Date   ALT 14 12/11/2009   AST 15 12/11/2009   ALKPHOS 57 12/11/2009   BILITOT 0.5 12/11/2009      RADIOGRAPHY: No results found.     IMPRESSION/PLAN: 1. 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 Dr. Lisbeth Renshaw discusses the pathology findings and reviews the nature of early stage right breast disease.  He reviews the rationale for radiotherapy following breast conservation surgery to reduce the risks of local recurrence.  Dr. Lindi Adie has also met with the patient and has reviewed the rationale for antiestrogen as another way of reducing risks of systemic recurrence.  Dr. Lisbeth Renshaw spent time today discussing the options and styles of radiotherapy that can be administered. With the patient's in situ implants, we discussed that there are protocols that also correspond to hypofractionated courses versus 6-1/2 weeks of therapy.  The patient is motivated to consider a hypofractionated course, understanding the long-term risks of radiation fibrosis as it relates to treatment with patients who have in situ implants. We discussed the risks, benefits, short,  and long term effects of radiotherapy, and the patient is interested in proceeding with whole breast radiotherapy in our department. Dr. Lisbeth Renshaw discusses the delivery and logistics of radiotherapy and recommends a course of 4 weeks of radiotherapy.  She will return for simulation tomorrow at 1 PM, and her treatments will be scheduled at that point. Written consent is obtained and placed in the chart, a copy was provided to the patient.   In a visit lasting 60 minutes, greater than 50% of the time was spent face to face reviewing her case, as well as in preparation of, discussing, and coordinating the patient's care.  The above documentation reflects my direct findings during this shared patient visit. Please see the separate note by Dr. Lisbeth Renshaw on this date for the remainder of the patient's plan of care.    Carola Rhine, PAC

## 2020-04-11 ENCOUNTER — Ambulatory Visit: Payer: Federal, State, Local not specified - PPO | Admitting: Radiation Oncology

## 2020-04-11 ENCOUNTER — Encounter: Payer: Self-pay | Admitting: Licensed Clinical Social Worker

## 2020-04-11 LAB — SURGICAL PATHOLOGY

## 2020-04-11 NOTE — Progress Notes (Signed)
[place] Clinical Social Work INITIAL SDOH Screening Note   Kari Rowland is a 59 y.o. year old female referred by distress screening protocol.   Ms. Borgmeyer was given information about support services today including CSW contact information, information about support team members and programs.   SDOH (Social Determinants of Health) assessments performed: Yes SDOH Interventions     Most Recent Value  SDOH Interventions  Financial Strain Interventions Intervention Not Indicated  Housing Interventions Intervention Not Indicated  Transportation Interventions Intervention Not Indicated        Family/Social Information:  . Housing Arrangement: patient lives with husband . Family members/support persons in your life? Husband, friends . Transportation: no concerns . Financial concerns: No  . Employment: Working full time. Works remotely  . Concerns about diagnosis and/or treatment: feels more confident now since switching to [facility]. Has had frustration with extended family members telling people about diagnosis and asking constantly about treatment and treating her like she is dying. CSW normalized feelings and discussed ways patient can try to mitigate situations and manage her responses . Patient reported stressors: extended family asking about treatment and diagnosis constantly Current coping skills/ strengths: Average or above average intelligence Capable of independent living Communication skills Financial means     SUMMARY: Current SDOH Barriers:  . none identified  Clinical Social Work Clinical Goal(s):  Marland Kitchen Patient will continue to attend medical appointments  Interventions: . Patient interviewed and SDOH assessment performed . Provided patient with information about support services  . Discussed strategies for managing constant and overwhelming inquiries regarding diagnosis and treatment   Follow Up Plan: Client will call as needed Patient verbalizes understanding of  plan: Yes   Edwinna Areola Genea Rheaume, LCSW

## 2020-04-12 ENCOUNTER — Other Ambulatory Visit: Payer: Self-pay

## 2020-04-12 ENCOUNTER — Ambulatory Visit
Admission: RE | Admit: 2020-04-12 | Discharge: 2020-04-12 | Disposition: A | Discharge status: Home / Self Care | Payer: Federal, State, Local not specified - PPO | Source: Ambulatory Visit | Attending: Radiation Oncology | Admitting: Radiation Oncology

## 2020-04-12 DIAGNOSIS — C50211 Malignant neoplasm of upper-inner quadrant of right female breast: Secondary | ICD-10-CM | POA: Insufficient documentation

## 2020-04-12 DIAGNOSIS — Z170 Estrogen receptor positive status [ER+]: Secondary | ICD-10-CM | POA: Diagnosis not present

## 2020-04-12 DIAGNOSIS — Z510 Encounter for antineoplastic radiation therapy: Secondary | ICD-10-CM | POA: Diagnosis not present

## 2020-04-17 ENCOUNTER — Telehealth: Payer: Self-pay | Admitting: *Deleted

## 2020-04-17 NOTE — Telephone Encounter (Signed)
Received call from pt with request to switch providers for radiation oncology from Dr. Lisbeth Renshaw to Dr. Isidore Moos.  Request sent via inbasket to both Drs. Moody and [place].  Informed pt will return call with next steps and plan. Received verbal understanding.

## 2020-04-18 ENCOUNTER — Encounter: Payer: Self-pay | Admitting: Radiation Oncology

## 2020-04-18 DIAGNOSIS — Z510 Encounter for antineoplastic radiation therapy: Secondary | ICD-10-CM | POA: Diagnosis not present

## 2020-04-18 DIAGNOSIS — Z170 Estrogen receptor positive status [ER+]: Secondary | ICD-10-CM | POA: Diagnosis not present

## 2020-04-18 DIAGNOSIS — C50211 Malignant neoplasm of upper-inner quadrant of right female breast: Secondary | ICD-10-CM | POA: Diagnosis not present

## 2020-04-18 NOTE — Progress Notes (Signed)
I spoke with Ms. Mantey today upon hearing from Bary Castilla that she wished to switch her care to my service in rad/onc.  I enjoyed our conversation.  I have reviewed Dr. Ida Rogue radiation plan and her medical records.  I will follow her on a weekly basis after she starts treatment.  She is pleased with this plan and I very much look forward to meeting her in person next week.  -----------------------------------  Eppie Gibson, MD

## 2020-04-20 ENCOUNTER — Ambulatory Visit
Admission: RE | Admit: 2020-04-20 | Payer: Federal, State, Local not specified - PPO | Source: Ambulatory Visit | Admitting: Radiation Oncology

## 2020-04-20 ENCOUNTER — Telehealth: Payer: Self-pay | Admitting: Hematology and Oncology

## 2020-04-20 ENCOUNTER — Encounter: Payer: Self-pay | Admitting: *Deleted

## 2020-04-20 DIAGNOSIS — Z510 Encounter for antineoplastic radiation therapy: Secondary | ICD-10-CM | POA: Diagnosis not present

## 2020-04-20 DIAGNOSIS — C50211 Malignant neoplasm of upper-inner quadrant of right female breast: Secondary | ICD-10-CM | POA: Diagnosis not present

## 2020-04-20 DIAGNOSIS — Z170 Estrogen receptor positive status [ER+]: Secondary | ICD-10-CM | POA: Diagnosis not present

## 2020-04-20 NOTE — Telephone Encounter (Signed)
Scheduled apt per 9/10 sch msg - mailed reminder letter with appt date and time

## 2020-04-23 ENCOUNTER — Other Ambulatory Visit: Payer: Self-pay

## 2020-04-23 ENCOUNTER — Ambulatory Visit
Admission: RE | Admit: 2020-04-23 | Discharge: 2020-04-23 | Disposition: A | Discharge status: Home / Self Care | Payer: Federal, State, Local not specified - PPO | Source: Ambulatory Visit | Attending: Radiation Oncology | Admitting: Radiation Oncology

## 2020-04-23 ENCOUNTER — Encounter: Payer: Self-pay | Admitting: *Deleted

## 2020-04-23 DIAGNOSIS — C50211 Malignant neoplasm of upper-inner quadrant of right female breast: Secondary | ICD-10-CM | POA: Diagnosis not present

## 2020-04-23 DIAGNOSIS — Z510 Encounter for antineoplastic radiation therapy: Secondary | ICD-10-CM | POA: Diagnosis not present

## 2020-04-23 DIAGNOSIS — Z170 Estrogen receptor positive status [ER+]: Secondary | ICD-10-CM | POA: Diagnosis not present

## 2020-04-23 MED ORDER — ALRA NON-METALLIC DEODORANT (RAD-ONC)
1.0000 "application " | Freq: Once | TOPICAL | Status: AC
  Administered 2020-04-23: 1 via TOPICAL

## 2020-04-23 MED ORDER — RADIAPLEXRX EX GEL: Freq: Once | CUTANEOUS | Status: AC

## 2020-04-23 NOTE — Progress Notes (Signed)

## 2020-04-24 ENCOUNTER — Ambulatory Visit
Admission: RE | Admit: 2020-04-24 | Discharge: 2020-04-24 | Disposition: A | Discharge status: Home / Self Care | Payer: Federal, State, Local not specified - PPO | Source: Ambulatory Visit | Attending: Radiation Oncology | Admitting: Radiation Oncology

## 2020-04-24 DIAGNOSIS — Z170 Estrogen receptor positive status [ER+]: Secondary | ICD-10-CM | POA: Diagnosis not present

## 2020-04-24 DIAGNOSIS — Z510 Encounter for antineoplastic radiation therapy: Secondary | ICD-10-CM | POA: Diagnosis not present

## 2020-04-24 DIAGNOSIS — C50211 Malignant neoplasm of upper-inner quadrant of right female breast: Secondary | ICD-10-CM | POA: Diagnosis not present

## 2020-04-25 ENCOUNTER — Ambulatory Visit
Admission: RE | Admit: 2020-04-25 | Discharge: 2020-04-25 | Disposition: A | Discharge status: Home / Self Care | Payer: Federal, State, Local not specified - PPO | Source: Ambulatory Visit | Attending: Radiation Oncology | Admitting: Radiation Oncology

## 2020-04-25 ENCOUNTER — Other Ambulatory Visit: Payer: Self-pay

## 2020-04-25 DIAGNOSIS — Z510 Encounter for antineoplastic radiation therapy: Secondary | ICD-10-CM | POA: Diagnosis not present

## 2020-04-25 DIAGNOSIS — C50211 Malignant neoplasm of upper-inner quadrant of right female breast: Secondary | ICD-10-CM | POA: Diagnosis not present

## 2020-04-25 DIAGNOSIS — Z170 Estrogen receptor positive status [ER+]: Secondary | ICD-10-CM | POA: Diagnosis not present

## 2020-04-26 ENCOUNTER — Ambulatory Visit: Payer: Federal, State, Local not specified - PPO

## 2020-04-27 ENCOUNTER — Ambulatory Visit: Admission: RE | Admit: 2020-04-27 | Payer: Federal, State, Local not specified - PPO | Source: Ambulatory Visit

## 2020-04-27 ENCOUNTER — Ambulatory Visit
Admission: RE | Admit: 2020-04-27 | Discharge: 2020-04-27 | Disposition: A | Discharge status: Home / Self Care | Payer: Federal, State, Local not specified - PPO | Source: Ambulatory Visit | Attending: Radiation Oncology | Admitting: Radiation Oncology

## 2020-04-27 ENCOUNTER — Other Ambulatory Visit: Payer: Self-pay

## 2020-04-27 DIAGNOSIS — Z170 Estrogen receptor positive status [ER+]: Secondary | ICD-10-CM | POA: Diagnosis not present

## 2020-04-27 DIAGNOSIS — Z510 Encounter for antineoplastic radiation therapy: Secondary | ICD-10-CM | POA: Diagnosis not present

## 2020-04-27 DIAGNOSIS — C50211 Malignant neoplasm of upper-inner quadrant of right female breast: Secondary | ICD-10-CM | POA: Diagnosis not present

## 2020-04-27 NOTE — Progress Notes (Signed)
I met with the patient today in CT simulation room. This week she has developed exquisite tenderness to her right anterior/inferior rib cage. She believes it is from the pressure of the breast board during RT.  No trauma otherwise. It is very painful for her to be in the prone position for treatment. She has requested replanning in the supine position.  We will replan her treatment today in the supine position. She will take her over-the-counter pain medication and attempt treatment today, but if she cannot tolerate it we will hold treatment today and resume treatment on Monday when her supine treatment plan is ready.  She is pleased with this plan.  -----------------------------------  Eppie Gibson, MD

## 2020-04-30 ENCOUNTER — Other Ambulatory Visit: Payer: Self-pay

## 2020-04-30 ENCOUNTER — Ambulatory Visit
Admission: RE | Admit: 2020-04-30 | Discharge: 2020-04-30 | Disposition: A | Discharge status: Home / Self Care | Payer: Federal, State, Local not specified - PPO | Source: Ambulatory Visit | Attending: Radiation Oncology | Admitting: Radiation Oncology

## 2020-04-30 DIAGNOSIS — Z170 Estrogen receptor positive status [ER+]: Secondary | ICD-10-CM | POA: Diagnosis not present

## 2020-04-30 DIAGNOSIS — Z510 Encounter for antineoplastic radiation therapy: Secondary | ICD-10-CM | POA: Diagnosis not present

## 2020-04-30 DIAGNOSIS — C50211 Malignant neoplasm of upper-inner quadrant of right female breast: Secondary | ICD-10-CM | POA: Diagnosis not present

## 2020-05-01 ENCOUNTER — Ambulatory Visit
Admission: RE | Admit: 2020-05-01 | Discharge: 2020-05-01 | Disposition: A | Discharge status: Home / Self Care | Payer: Federal, State, Local not specified - PPO | Source: Ambulatory Visit | Attending: Radiation Oncology | Admitting: Radiation Oncology

## 2020-05-01 ENCOUNTER — Ambulatory Visit: Payer: Federal, State, Local not specified - PPO | Admitting: Rehabilitation

## 2020-05-01 DIAGNOSIS — C50211 Malignant neoplasm of upper-inner quadrant of right female breast: Secondary | ICD-10-CM | POA: Diagnosis not present

## 2020-05-01 DIAGNOSIS — Z170 Estrogen receptor positive status [ER+]: Secondary | ICD-10-CM | POA: Diagnosis not present

## 2020-05-01 DIAGNOSIS — Z510 Encounter for antineoplastic radiation therapy: Secondary | ICD-10-CM | POA: Diagnosis not present

## 2020-05-02 ENCOUNTER — Ambulatory Visit
Admission: RE | Admit: 2020-05-02 | Discharge: 2020-05-02 | Disposition: A | Discharge status: Home / Self Care | Payer: Federal, State, Local not specified - PPO | Source: Ambulatory Visit | Attending: Radiation Oncology | Admitting: Radiation Oncology

## 2020-05-02 ENCOUNTER — Other Ambulatory Visit: Payer: Self-pay

## 2020-05-02 ENCOUNTER — Telehealth: Payer: Self-pay | Admitting: Hematology and Oncology

## 2020-05-02 DIAGNOSIS — Z510 Encounter for antineoplastic radiation therapy: Secondary | ICD-10-CM | POA: Diagnosis not present

## 2020-05-02 DIAGNOSIS — C50211 Malignant neoplasm of upper-inner quadrant of right female breast: Secondary | ICD-10-CM | POA: Diagnosis not present

## 2020-05-02 DIAGNOSIS — Z170 Estrogen receptor positive status [ER+]: Secondary | ICD-10-CM | POA: Diagnosis not present

## 2020-05-02 NOTE — Telephone Encounter (Signed)
Rescheduled appt on 10/5 to 10/7 per pt request. Pt is aware of appt time and date.

## 2020-05-03 ENCOUNTER — Ambulatory Visit
Admission: RE | Admit: 2020-05-03 | Discharge: 2020-05-03 | Disposition: A | Discharge status: Home / Self Care | Payer: Federal, State, Local not specified - PPO | Source: Ambulatory Visit | Attending: Radiation Oncology | Admitting: Radiation Oncology

## 2020-05-03 DIAGNOSIS — Z510 Encounter for antineoplastic radiation therapy: Secondary | ICD-10-CM | POA: Diagnosis not present

## 2020-05-03 DIAGNOSIS — C50211 Malignant neoplasm of upper-inner quadrant of right female breast: Secondary | ICD-10-CM | POA: Diagnosis not present

## 2020-05-03 DIAGNOSIS — Z170 Estrogen receptor positive status [ER+]: Secondary | ICD-10-CM | POA: Diagnosis not present

## 2020-05-04 ENCOUNTER — Other Ambulatory Visit: Payer: Self-pay

## 2020-05-04 ENCOUNTER — Ambulatory Visit
Admission: RE | Admit: 2020-05-04 | Discharge: 2020-05-04 | Disposition: A | Discharge status: Home / Self Care | Payer: Federal, State, Local not specified - PPO | Source: Ambulatory Visit | Attending: Radiation Oncology | Admitting: Radiation Oncology

## 2020-05-04 DIAGNOSIS — Z510 Encounter for antineoplastic radiation therapy: Secondary | ICD-10-CM | POA: Diagnosis not present

## 2020-05-04 DIAGNOSIS — C50211 Malignant neoplasm of upper-inner quadrant of right female breast: Secondary | ICD-10-CM | POA: Diagnosis not present

## 2020-05-04 DIAGNOSIS — Z170 Estrogen receptor positive status [ER+]: Secondary | ICD-10-CM | POA: Diagnosis not present

## 2020-05-07 ENCOUNTER — Ambulatory Visit
Admission: RE | Admit: 2020-05-07 | Discharge: 2020-05-07 | Disposition: A | Discharge status: Home / Self Care | Payer: Federal, State, Local not specified - PPO | Source: Ambulatory Visit | Attending: Radiation Oncology | Admitting: Radiation Oncology

## 2020-05-07 ENCOUNTER — Other Ambulatory Visit: Payer: Self-pay

## 2020-05-07 DIAGNOSIS — Z510 Encounter for antineoplastic radiation therapy: Secondary | ICD-10-CM | POA: Diagnosis not present

## 2020-05-07 DIAGNOSIS — C50211 Malignant neoplasm of upper-inner quadrant of right female breast: Secondary | ICD-10-CM | POA: Diagnosis not present

## 2020-05-07 DIAGNOSIS — Z170 Estrogen receptor positive status [ER+]: Secondary | ICD-10-CM | POA: Diagnosis not present

## 2020-05-08 ENCOUNTER — Ambulatory Visit
Admission: RE | Admit: 2020-05-08 | Discharge: 2020-05-08 | Disposition: A | Discharge status: Home / Self Care | Payer: Federal, State, Local not specified - PPO | Source: Ambulatory Visit | Attending: Radiation Oncology | Admitting: Radiation Oncology

## 2020-05-08 ENCOUNTER — Other Ambulatory Visit: Payer: Self-pay

## 2020-05-08 ENCOUNTER — Encounter: Payer: Federal, State, Local not specified - PPO | Admitting: Rehabilitation

## 2020-05-08 DIAGNOSIS — Z170 Estrogen receptor positive status [ER+]: Secondary | ICD-10-CM | POA: Diagnosis not present

## 2020-05-08 DIAGNOSIS — Z510 Encounter for antineoplastic radiation therapy: Secondary | ICD-10-CM | POA: Diagnosis not present

## 2020-05-08 DIAGNOSIS — C50211 Malignant neoplasm of upper-inner quadrant of right female breast: Secondary | ICD-10-CM | POA: Diagnosis not present

## 2020-05-09 ENCOUNTER — Ambulatory Visit
Admission: RE | Admit: 2020-05-09 | Discharge: 2020-05-09 | Disposition: A | Discharge status: Home / Self Care | Payer: Federal, State, Local not specified - PPO | Source: Ambulatory Visit | Attending: Radiation Oncology | Admitting: Radiation Oncology

## 2020-05-09 DIAGNOSIS — Z510 Encounter for antineoplastic radiation therapy: Secondary | ICD-10-CM | POA: Diagnosis not present

## 2020-05-09 DIAGNOSIS — C50211 Malignant neoplasm of upper-inner quadrant of right female breast: Secondary | ICD-10-CM | POA: Diagnosis not present

## 2020-05-09 DIAGNOSIS — Z170 Estrogen receptor positive status [ER+]: Secondary | ICD-10-CM | POA: Diagnosis not present

## 2020-05-10 ENCOUNTER — Ambulatory Visit
Admission: RE | Admit: 2020-05-10 | Discharge: 2020-05-10 | Disposition: A | Discharge status: Home / Self Care | Payer: Federal, State, Local not specified - PPO | Source: Ambulatory Visit | Attending: Radiation Oncology | Admitting: Radiation Oncology

## 2020-05-10 DIAGNOSIS — Z170 Estrogen receptor positive status [ER+]: Secondary | ICD-10-CM | POA: Diagnosis not present

## 2020-05-10 DIAGNOSIS — Z510 Encounter for antineoplastic radiation therapy: Secondary | ICD-10-CM | POA: Diagnosis not present

## 2020-05-10 DIAGNOSIS — C50211 Malignant neoplasm of upper-inner quadrant of right female breast: Secondary | ICD-10-CM | POA: Diagnosis not present

## 2020-05-11 ENCOUNTER — Other Ambulatory Visit: Payer: Self-pay

## 2020-05-11 ENCOUNTER — Ambulatory Visit
Admission: RE | Admit: 2020-05-11 | Discharge: 2020-05-11 | Disposition: A | Discharge status: Home / Self Care | Payer: Federal, State, Local not specified - PPO | Source: Ambulatory Visit | Attending: Radiation Oncology | Admitting: Radiation Oncology

## 2020-05-11 DIAGNOSIS — Z170 Estrogen receptor positive status [ER+]: Secondary | ICD-10-CM | POA: Insufficient documentation

## 2020-05-11 DIAGNOSIS — C50211 Malignant neoplasm of upper-inner quadrant of right female breast: Secondary | ICD-10-CM | POA: Diagnosis not present

## 2020-05-14 ENCOUNTER — Ambulatory Visit
Admission: RE | Admit: 2020-05-14 | Discharge: 2020-05-14 | Disposition: A | Discharge status: Home / Self Care | Payer: Federal, State, Local not specified - PPO | Source: Ambulatory Visit | Attending: Radiation Oncology | Admitting: Radiation Oncology

## 2020-05-14 ENCOUNTER — Other Ambulatory Visit: Payer: Self-pay

## 2020-05-14 DIAGNOSIS — Z170 Estrogen receptor positive status [ER+]: Secondary | ICD-10-CM | POA: Diagnosis not present

## 2020-05-14 DIAGNOSIS — C50211 Malignant neoplasm of upper-inner quadrant of right female breast: Secondary | ICD-10-CM | POA: Diagnosis not present

## 2020-05-15 ENCOUNTER — Other Ambulatory Visit: Payer: Self-pay

## 2020-05-15 ENCOUNTER — Ambulatory Visit: Payer: Federal, State, Local not specified - PPO | Admitting: Hematology and Oncology

## 2020-05-15 ENCOUNTER — Ambulatory Visit
Admission: RE | Admit: 2020-05-15 | Discharge: 2020-05-15 | Disposition: A | Discharge status: Home / Self Care | Payer: Federal, State, Local not specified - PPO | Source: Ambulatory Visit | Attending: Radiation Oncology | Admitting: Radiation Oncology

## 2020-05-15 ENCOUNTER — Ambulatory Visit: Payer: Federal, State, Local not specified - PPO

## 2020-05-15 DIAGNOSIS — C50211 Malignant neoplasm of upper-inner quadrant of right female breast: Secondary | ICD-10-CM | POA: Diagnosis not present

## 2020-05-15 DIAGNOSIS — Z170 Estrogen receptor positive status [ER+]: Secondary | ICD-10-CM | POA: Diagnosis not present

## 2020-05-16 ENCOUNTER — Other Ambulatory Visit: Payer: Self-pay

## 2020-05-16 ENCOUNTER — Ambulatory Visit: Payer: Federal, State, Local not specified - PPO

## 2020-05-16 ENCOUNTER — Ambulatory Visit
Admission: RE | Admit: 2020-05-16 | Discharge: 2020-05-16 | Disposition: A | Discharge status: Home / Self Care | Payer: Federal, State, Local not specified - PPO | Source: Ambulatory Visit | Attending: Radiation Oncology | Admitting: Radiation Oncology

## 2020-05-16 DIAGNOSIS — Z170 Estrogen receptor positive status [ER+]: Secondary | ICD-10-CM | POA: Diagnosis not present

## 2020-05-16 DIAGNOSIS — C50211 Malignant neoplasm of upper-inner quadrant of right female breast: Secondary | ICD-10-CM | POA: Diagnosis not present

## 2020-05-16 NOTE — Progress Notes (Signed)
Patient Care Team: Panosh, Standley Brooking, MD as PCP - Philomena Doheny, Paulette Blanch, RN as Oncology Nurse Navigator Rockwell Germany, RN as Oncology Nurse Navigator  DIAGNOSIS:    ICD-10-CM   1. Malignant neoplasm of upper-inner quadrant of right breast in female, estrogen receptor positive ([place])  C50.211    Z17.0     SUMMARY OF ONCOLOGIC HISTORY: Oncology History  Malignant neoplasm of upper-inner quadrant of right breast in female, estrogen receptor positive ([place])  02/03/2020 Initial Diagnosis   Patient palpated a right beast lump for several months. Mammogram on 01/26/20 showed a 1.2cm mass in the right breast at the 1 o'clock position, a 0.4cm mass at the 12 o'clock position, and no evidence of abnormal axillary adenopathy. 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 She has a history of saline breast implants.   03/09/2020 Surgery   Right lumpectomy (Dr. Genia Hotter) : IDC 1.5 cm, grade 2, margins negative, 0/3 lymph nodes negative Second right lumpectomy: grade 2 IDC 7 mm, margins negative, ER 90%, PR 90%, HER-2 negative, Ki-67 20%   03/10/2020 Oncotype testing   Oncotype DX recurrence score 11, distant recurrence at 9 years: 3%, no chemotherapy benefit   04/23/2020 -  Radiation Therapy   Adjuvant radiation therapy     CHIEF COMPLIANT: Follow-up of right breast cancer to discuss antiestrogen therapy  INTERVAL HISTORY: Kari Rowland is a 59 y.o. with above-mentioned history of right breast cancer who underwent a lumpectomy and is currently undergoing radiation treatment. She presents to the clinic today to discuss antiestrogen therapy.   ALLERGIES:  is allergic to procaine, sulfa antibiotics, azithromycin, erythromycin, morphine, other, rosuvastatin, sulfonamide derivatives, tetracycline, and ciprofloxacin.  MEDICATIONS:  Current Outpatient Medications  Medication Sig Dispense Refill  . atorvastatin (LIPITOR) 20 MG  tablet Take 20 mg by mouth daily.    . cholecalciferol (VITAMIN D) 400 UNITS TABS Take 1,000 Units by mouth daily.      . diazepam (VALIUM) 5 MG tablet 1/2 to 1 tab po tid prn vertigo (Patient not taking: Reported on 04/10/2020) 20 tablet 0  . omeprazole (PRILOSEC) 40 MG capsule Take 1 capsule (40 mg total) by mouth daily. (Patient not taking: Reported on 04/10/2020) 30 capsule 6  . vitamin B-12 (CYANOCOBALAMIN) 1000 MCG tablet 1,000 mcg daily.     No current facility-administered medications for this visit.    PHYSICAL EXAMINATION: ECOG PERFORMANCE STATUS: 1 - Symptomatic but completely ambulatory  There were no vitals filed for this visit. There were no vitals filed for this visit.     LABORATORY DATA:  I have reviewed the data as listed CMP Latest Ref Rng & Units 12/11/2009 08/14/2008 08/30/2007  Glucose 70 - 99 mg/dL 93 83 99  BUN 6 - 23 mg/dL _0 Creatinine 0.4 - 1.2 mg/dL 0.8 0.9 0.9  Sodium 135 - 145 meq/L 144 144 139  Potassium 3.5 - 5.1 meq/L 4.8 3.7 4.1  Chloride 96 - 112 meq/L 106 108 103  CO2 19 - 32 meq/L _1 Calcium 8.4 - 10.5 mg/dL 9.6 9.4 9.1  Total Protein 6.0 - 8.3 g/dL 6.6 6.3 5.7(L)  Total Bilirubin 0.3 - 1.2 mg/dL 0.5 0.9 0.7  Alkaline Phos 39 - 117 units/L 57 47 31(L)  AST 0 - 37 units/L _2 ALT 0 - 35 units/L _3 Lab Results  Component Value Date  WBC 5.4 08/14/2008   HGB 14.4 08/14/2008   HCT 41.1 08/14/2008   MCV 94.0 08/14/2008   PLT 198 08/14/2008   NEUTROABS 3.4 08/30/2007    ASSESSMENT & PLAN:  Malignant neoplasm of upper-inner quadrant of right breast in female, estrogen receptor positive ([place]) 02/03/2020:Patient palpated a right beast lump for several months. Mammogram on 01/26/20 showed a 1.2cm mass in the right breast at the 1 o'clock position, a 0.4cm mass at the 12 o'clock position, and no evidence of abnormal axillary adenopathy. Biopsy on 02/03/20 showed invasive ductal carcinoma with DCIS, grade 2, HER-2 negative  (+1), ER+ >90%, PR+ 90%, Ki67 10%. She has a history of saline breast implants. Genetic testing: Negative  03/09/2020: Right lumpectomy (Dr. Genia Hotter) : IDC 1.5 cm, grade 2, margins negative, 0/3 lymph nodes negative, right second lumpectomy: Grade 2 IDC 7 mm, margins negative, ER 90%, PR 90%, HER-2 negative, Ki-67 20% Oncotype DX recurrence score 11, distant recurrence at 9 years: 3%, no chemotherapy benefit Second Oncotype has been ordered on the second tumor: Recurrence score 9: Distant recurrence at 9 years: 3%  Recommendations: 1. Adjuvant radiation therapy started 04/23/2020 2. Adjuvant antiestrogen therapy  Anastrozole counseling: We discussed the risks and benefits of anti-estrogen therapy with aromatase inhibitors. These include but not limited to insomnia, hot flashes, mood changes, vaginal dryness, bone density loss, and weight gain. We strongly believe that the benefits far outweigh the risks. Patient understands these risks and consented to starting treatment. Planned treatment duration is 7 years.  Patient is very anxious about the side effects of anastrozole therapy especially because she has family history of heart disease and she has high cholesterol. She will start at half a tablet daily and see if she tolerates it well then she can go up to full tablet.  I sent a 30-day prescription for anastrozole.  I will send a referral to Dr. Haroldine Laws with cardiology.  I will see her in 1 month for MyChart virtual visit to assess tolerance to anastrozole. Return to clinic in 3 months for survivorship care plan visit    No orders of the defined types were placed in this encounter.  The patient has a good understanding of the overall plan. she agrees with it. she will call with any problems that may develop before the next visit here.  Total time spent: 30 mins including face to face time and time spent for planning, charting and coordination of care  Nicholas Lose,  MD 05/17/2020  I, Cloyde Reams Dorshimer, am acting as scribe for Dr. Nicholas Lose.  I have reviewed the above documentation for accuracy and completeness, and I agree with the above.

## 2020-05-17 ENCOUNTER — Inpatient Hospital Stay
Payer: Federal, State, Local not specified - PPO | Attending: Hematology and Oncology | Admitting: Hematology and Oncology

## 2020-05-17 ENCOUNTER — Other Ambulatory Visit: Payer: Self-pay

## 2020-05-17 ENCOUNTER — Ambulatory Visit: Payer: Federal, State, Local not specified - PPO

## 2020-05-17 ENCOUNTER — Encounter: Payer: Self-pay | Admitting: *Deleted

## 2020-05-17 ENCOUNTER — Ambulatory Visit
Admission: RE | Admit: 2020-05-17 | Discharge: 2020-05-17 | Disposition: A | Discharge status: Home / Self Care | Payer: Federal, State, Local not specified - PPO | Source: Ambulatory Visit | Attending: Radiation Oncology | Admitting: Radiation Oncology

## 2020-05-17 DIAGNOSIS — Z170 Estrogen receptor positive status [ER+]: Secondary | ICD-10-CM | POA: Diagnosis not present

## 2020-05-17 DIAGNOSIS — Z79899 Other long term (current) drug therapy: Secondary | ICD-10-CM | POA: Diagnosis not present

## 2020-05-17 DIAGNOSIS — C50211 Malignant neoplasm of upper-inner quadrant of right female breast: Secondary | ICD-10-CM | POA: Diagnosis not present

## 2020-05-17 DIAGNOSIS — F419 Anxiety disorder, unspecified: Secondary | ICD-10-CM | POA: Diagnosis not present

## 2020-05-17 DIAGNOSIS — E7800 Pure hypercholesterolemia, unspecified: Secondary | ICD-10-CM | POA: Insufficient documentation

## 2020-05-17 DIAGNOSIS — Z79811 Long term (current) use of aromatase inhibitors: Secondary | ICD-10-CM | POA: Diagnosis not present

## 2020-05-17 MED ORDER — ANASTROZOLE 1 MG PO TABS: 0.5000 mg | ORAL_TABLET | Freq: Every day | ORAL | 0 refills | Status: DC

## 2020-05-17 NOTE — Assessment & Plan Note (Signed)
02/03/2020:Patient palpated a right beast lump for several months. Mammogram on 01/26/20 showed a 1.2cm mass in the right breast at the 1 o'clock position, a 0.4cm mass at the 12 o'clock position, and no evidence of abnormal axillary adenopathy. 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 She has a history of saline breast implants. Genetic testing: Negative  03/09/2020: Right lumpectomy (Dr. Genia Hotter) : IDC 1.5 cm, grade 2, margins negative, 0/3 lymph nodes negative, right second lumpectomy: Grade 2 IDC 7 mm, margins negative, ER 90%, PR 90%, HER-2 negative, Ki-67 20% Oncotype DX recurrence score 11, distant recurrence at 9 years: 3%, no chemotherapy benefit Second Oncotype has been ordered on the second tumor: Recurrence score 9: Distant recurrence at 9 years: 3%  Recommendations: 1. Adjuvant radiation therapy started 04/23/2020 2. Adjuvant antiestrogen therapy  Anastrozole counseling: We discussed the risks and benefits of anti-estrogen therapy with aromatase inhibitors. These include but not limited to insomnia, hot flashes, mood changes, vaginal dryness, bone density loss, and weight gain. We strongly believe that the benefits far outweigh the risks. Patient understands these risks and consented to starting treatment. Planned treatment duration is 7 years.  Return to clinic in 3 months for survivorship care plan visit

## 2020-05-18 ENCOUNTER — Ambulatory Visit
Admission: RE | Admit: 2020-05-18 | Discharge: 2020-05-18 | Disposition: A | Discharge status: Home / Self Care | Payer: Federal, State, Local not specified - PPO | Source: Ambulatory Visit | Attending: Radiation Oncology | Admitting: Radiation Oncology

## 2020-05-18 ENCOUNTER — Ambulatory Visit: Payer: Federal, State, Local not specified - PPO

## 2020-05-18 DIAGNOSIS — C50211 Malignant neoplasm of upper-inner quadrant of right female breast: Secondary | ICD-10-CM | POA: Diagnosis not present

## 2020-05-18 DIAGNOSIS — Z170 Estrogen receptor positive status [ER+]: Secondary | ICD-10-CM | POA: Diagnosis not present

## 2020-05-21 ENCOUNTER — Ambulatory Visit: Payer: Federal, State, Local not specified - PPO

## 2020-05-21 ENCOUNTER — Ambulatory Visit
Admission: RE | Admit: 2020-05-21 | Discharge: 2020-05-21 | Disposition: A | Discharge status: Home / Self Care | Payer: Federal, State, Local not specified - PPO | Source: Ambulatory Visit | Attending: Radiation Oncology | Admitting: Radiation Oncology

## 2020-05-21 ENCOUNTER — Encounter: Payer: Self-pay | Admitting: *Deleted

## 2020-05-21 DIAGNOSIS — Z170 Estrogen receptor positive status [ER+]: Secondary | ICD-10-CM | POA: Diagnosis not present

## 2020-05-21 DIAGNOSIS — C50211 Malignant neoplasm of upper-inner quadrant of right female breast: Secondary | ICD-10-CM | POA: Diagnosis not present

## 2020-05-21 MED ORDER — RADIAPLEXRX EX GEL: Freq: Once | CUTANEOUS | Status: AC

## 2020-05-22 ENCOUNTER — Other Ambulatory Visit: Payer: Self-pay

## 2020-05-22 ENCOUNTER — Ambulatory Visit
Admission: RE | Admit: 2020-05-22 | Discharge: 2020-05-22 | Disposition: A | Discharge status: Home / Self Care | Payer: Federal, State, Local not specified - PPO | Source: Ambulatory Visit | Attending: Radiation Oncology | Admitting: Radiation Oncology

## 2020-05-22 ENCOUNTER — Ambulatory Visit: Payer: Federal, State, Local not specified - PPO

## 2020-05-22 ENCOUNTER — Encounter: Payer: Self-pay | Admitting: Radiation Oncology

## 2020-05-22 DIAGNOSIS — Z170 Estrogen receptor positive status [ER+]: Secondary | ICD-10-CM | POA: Diagnosis not present

## 2020-05-22 DIAGNOSIS — C50211 Malignant neoplasm of upper-inner quadrant of right female breast: Secondary | ICD-10-CM | POA: Diagnosis not present

## 2020-05-23 ENCOUNTER — Ambulatory Visit: Payer: Federal, State, Local not specified - PPO

## 2020-05-25 ENCOUNTER — Other Ambulatory Visit: Payer: Self-pay

## 2020-05-25 ENCOUNTER — Telehealth: Payer: Self-pay

## 2020-05-25 DIAGNOSIS — C50211 Malignant neoplasm of upper-inner quadrant of right female breast: Secondary | ICD-10-CM

## 2020-05-25 DIAGNOSIS — Z170 Estrogen receptor positive status [ER+]: Secondary | ICD-10-CM

## 2020-05-25 MED ORDER — SILVER SULFADIAZINE 1 % EX CREA: 1.0000 "application " | TOPICAL_CREAM | Freq: Every day | CUTANEOUS | 0 refills | Status: AC

## 2020-05-25 NOTE — Telephone Encounter (Signed)
Patient called asking if Dr. Isidore Moos would send in a prescription for Silvadene to help with areas of her skin that are very raw and tender from peeling after radiation. Dr. Isidore Moos updated, and stated she typically doesn't recommend Silvadene unless wound is weeping/moist. Instead she would recommend patient use 4% lidocaine cream every 3 hours as needed.   Returned patient's call and relayed Dr. Pearlie Oyster recommendations. Patient stated that she has already tried topical lidocaine cream and it only provided relief for about 10 min. She also clarified that she is having some weeping from the raw skin under her breast and right axilla. She stated it is very uncomfortable to wear clothes and sleep comfortably, because of fabric sticking to the areas and sensitivity of the skin. Informed her I would update Dr. Isidore Moos with this information, and call back with her recommendations.  Updated Dr. Isidore Moos who stated she was comfortable with prescribing Silvadene as long as patient did not have a Sulfa allergy (or had used cream before without issue). She advised that patient should use twice a day, and be sure to gently cleanse/dry skin before reapplication.   Called patient back to inquire about sulfa allergy and if she had ever used Silvadene before. Patient stated that her sulfa allergy occurred over 20 years ago when she had to be on a sulfa antibiotic for several weeks. She also reported that she's used Silvadene before on abrasions/burns without all issue. Advised patient of Dr. Pearlie Oyster recommendations, and for patient to stop use of cream and call our office should her skin have any adverse reaction. Patient verbalized understanding and agreement and denied any other needs at this time.   Prescription called into [facility] in [facility] per patient's request.

## 2020-06-21 ENCOUNTER — Telehealth: Payer: Self-pay | Admitting: Radiation Oncology

## 2020-06-21 NOTE — Telephone Encounter (Signed)
Kari Rowland called wishing to change her f/u appt with Dr. Isidore Moos from tomorrow to 11[place]16 @ 2:40 [place]. She wishes for her f/u to be by telephone. Letting Dr. Isidore Moos and nurse Egbert Garibaldi know.

## 2020-06-22 ENCOUNTER — Ambulatory Visit: Payer: Federal, State, Local not specified - PPO | Admitting: Radiation Oncology

## 2020-06-26 ENCOUNTER — Ambulatory Visit
Admission: RE | Admit: 2020-06-26 | Payer: Federal, State, Local not specified - PPO | Source: Ambulatory Visit | Admitting: Radiation Oncology

## 2020-06-26 ENCOUNTER — Other Ambulatory Visit: Payer: Self-pay

## 2020-06-26 ENCOUNTER — Telehealth: Payer: Self-pay

## 2020-06-26 NOTE — Telephone Encounter (Signed)
I called the patient today about her upcoming follow-up appointment in radiation oncology.   Given the state of the COVID-19 pandemic, concerning case numbers in our community, and guidance from [facility], I offered a phone assessment with the patient to determine if coming to the clinic was necessary. She accepted.  I let the patient know that I had spoken with Dr. Isidore Moos, and she wanted them to know the importance of washing their hands for at least 20 seconds at a time, especially after going out in public, and before they eat.  Limit going out in public whenever possible. Do not touch your face, unless your hands are clean, such as when bathing. Get plenty of rest, eat well, and stay hydrated. Patient verbalized understanding and agreement  The patient denies any symptomatic concerns. She reports some lingering fatigue, but also admits that she is busy with work and [place]. She reports an occasional sharp pain to the breast, but states it resolves almost instantly. She states her shoulder is a bit stiff/limited in range of motion, but she feels she is able to manage and work out tension. Reports she's sleeping well and has a stable appetite. Specifically, she reports good healing of her skin in the radiation fields.  Skin is intact and almost completely back to her baseline coloring (though the areola remains darker than her other breast). She has already been applying vitamin E to her skin per Dr. Pearlie Oyster recommendation.     Continue follow-up with medical oncology - follow-up is scheduled virtually on 07/11/2020 with Dr. Nicholas Lose.  I explained that yearly mammograms are important for patients with intact breast tissue, and physical exams are important after mastectomy for patients that cannot undergo mammography.  I encouraged her to call if she had further questions or concerns about her healing. Otherwise, she will follow-up PRN in radiation oncology.  Patient is pleased with this plan, and we will cancel her upcoming follow-up to reduce the risk of COVID-19 transmission.

## 2020-07-10 ENCOUNTER — Telehealth: Payer: Self-pay | Admitting: Hematology and Oncology

## 2020-07-10 NOTE — Telephone Encounter (Signed)
Rescheduled appointments per 11/30 sch msg. Spoke to patient who is aware of appointment date and time.

## 2020-07-11 ENCOUNTER — Inpatient Hospital Stay: Payer: Federal, State, Local not specified - PPO | Admitting: Hematology and Oncology

## 2020-07-17 NOTE — Progress Notes (Signed)
  Patient Name: Kari Rowland MRN: [id number] DOB: [birth year] Referring Physician: Shanon Ace (Profile Not Attached) Date of Service: 05/22/2020 [facility], [place]                                                        End Of Treatment Note  Diagnoses: C50.211-Malignant neoplasm of upper-inner quadrant of right female breast  Cancer Staging: Cancer Staging Malignant neoplasm of upper-inner quadrant of right breast in female, estrogen receptor positive ([place]) Staging form: Breast, AJCC 8th Edition - Pathologic stage from 04/10/2020: Stage IA (pT1c, pN0(sn), cM0, G2, ER+, PR+, HER2-, Oncotype DX score: 11) - Unsigned  Intent: Curative  Radiation Treatment Dates: 04/23/2020 through 05/22/2020 Site Technique Total Dose (Gy) Dose per Fx (Gy) Completed Fx Beam Energies  Breast, Right: Breast_Rt 3D 42.56/42.56 2.66 16/16 6X, 10X  Breast, Right: Breast_Rt_Bst 3D 8/8 2 4/4 6X   Narrative: The patient tolerated radiation therapy relatively well.   Plan: The patient will follow-up with radiation oncology in 33mo or PRN.  -----------------------------------  SEppie Gibson MD

## 2020-08-10 IMAGING — MR MRI LUMBAR SPINE WITHOUT CONTRAST
4 of 5 series · 24 of 48 positions shown · non-contrast
Comparison: Plain films lumbar spine 03/22/2019 from [REDACTED].

CLINICAL DATA: Low back pain radiating to both hips. Bilateral
lower extremity weakness and numbness. Symptoms are chronic. No
known injury.

EXAM:
MRI LUMBAR SPINE WITHOUT CONTRAST
TECHNIQUE: Multiplanar, multisequence MR imaging of the lumbar spine was
performed. No intravenous contrast was administered.

[Series 5: T2 · sagittal · 4.0mm · 0.73mm/px · 6 of 15 slices shown (1 of 2)]
[im 1/15]
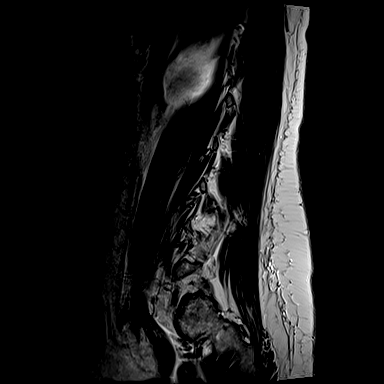
[im 3/15]
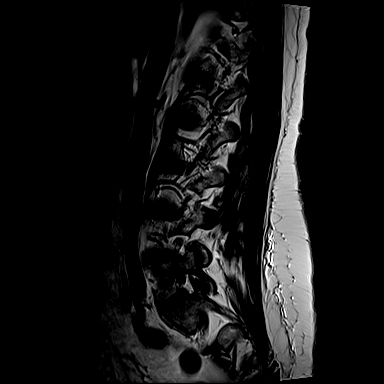
[im 6/15]
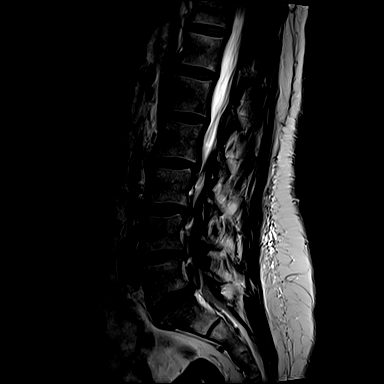
[im 9/15]
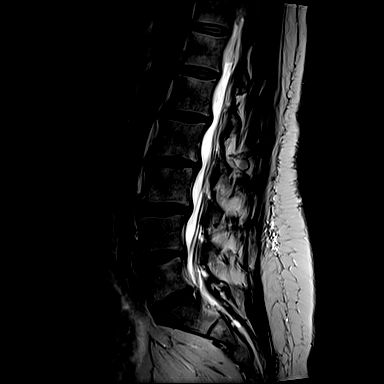
[im 12/15]
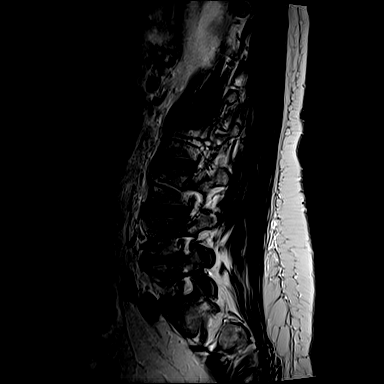
[im 15/15]
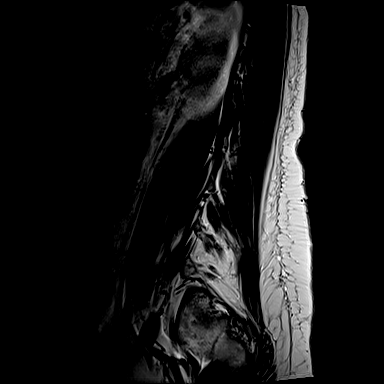

[Series 6: T1 · sagittal · 4.0mm · 0.73mm/px · 6 of 15 slices shown (1 of 2)]
[im 1/15]
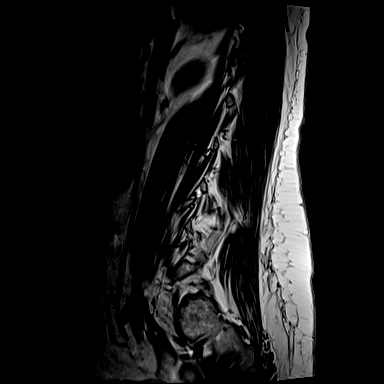
[im 3/15]
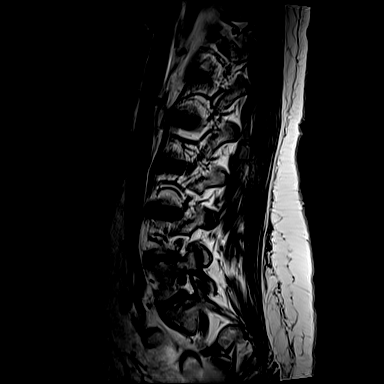
[im 6/15]
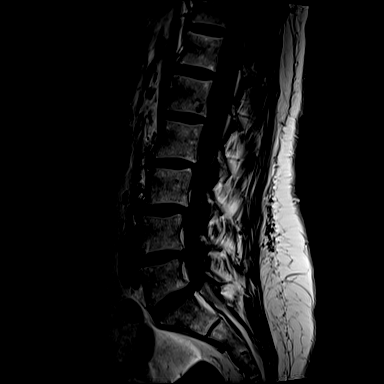
[im 9/15]
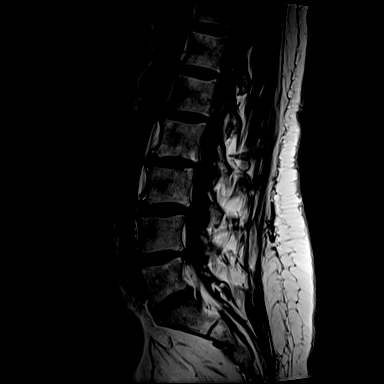
[im 12/15]
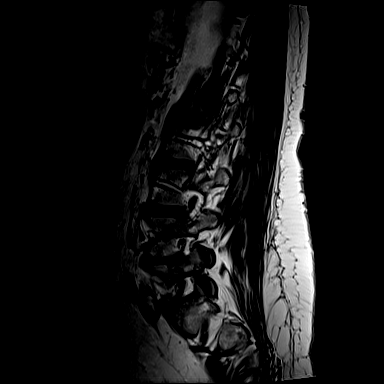
[im 15/15]
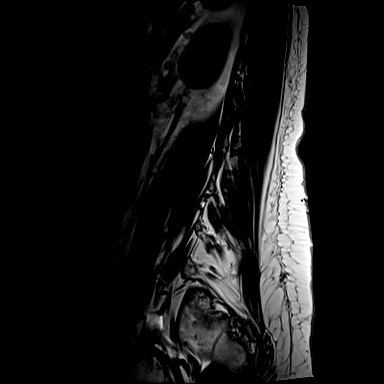

[Series 10: T1 · axial · 4.0mm · 0.35mm/px · z∈[-31,+135]mm · 3 of 40 slices shown (2 of 2)]
[im 6/40]
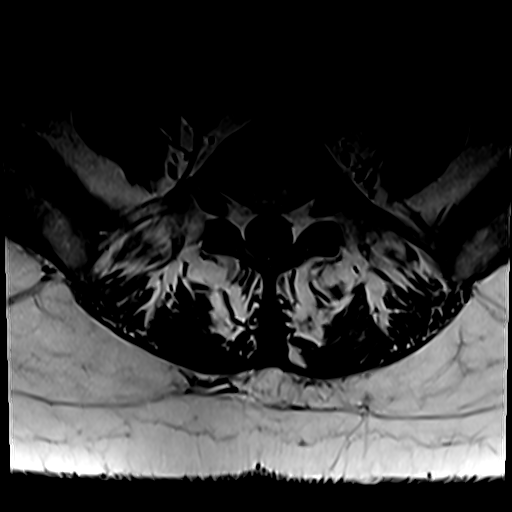
[im 20/40]
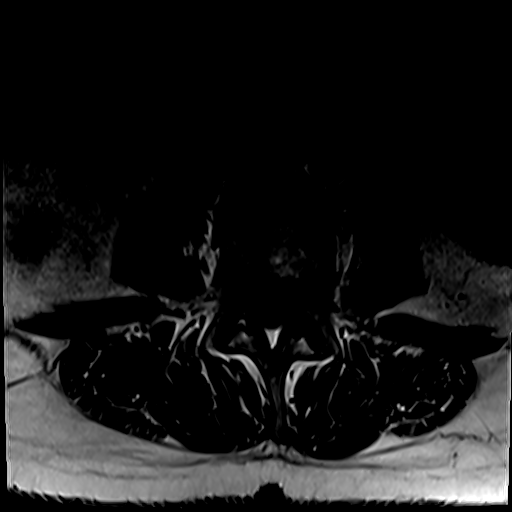
[im 34/40]
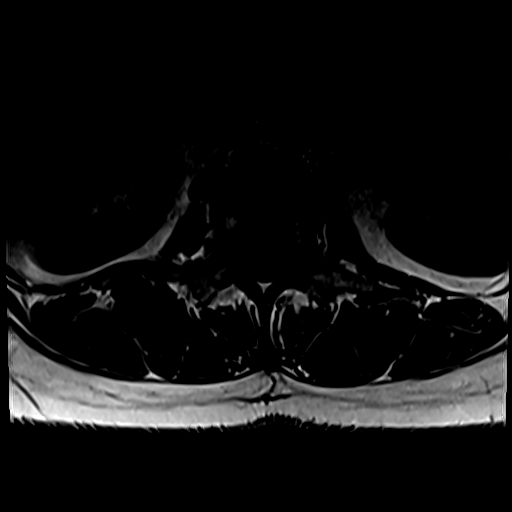

[Series 13: T2 · axial · 4.0mm · 0.35mm/px · z∈[-55,+165]mm · 9 of 40 slices shown (2 of 2)]
[im 1/40]
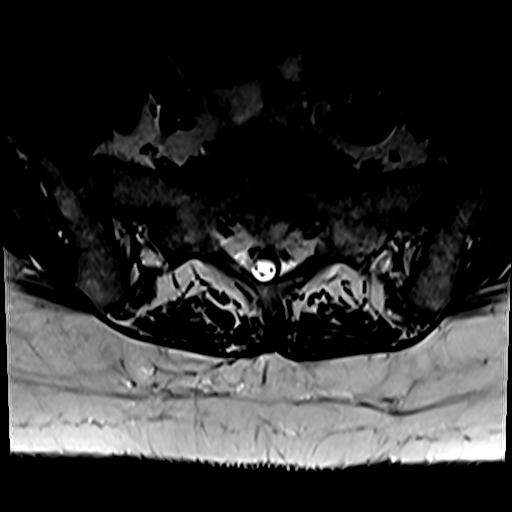
[im 6/40]
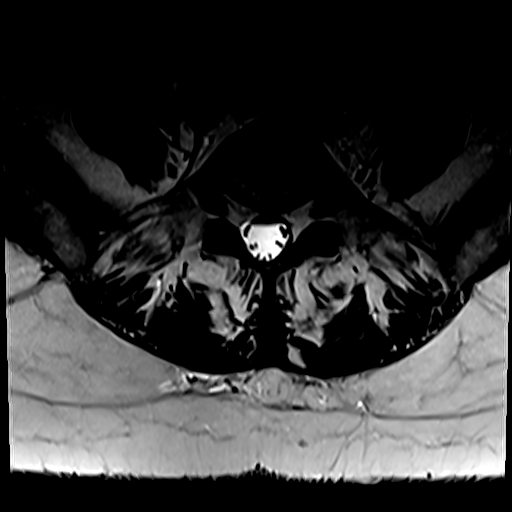
[im 12/40]
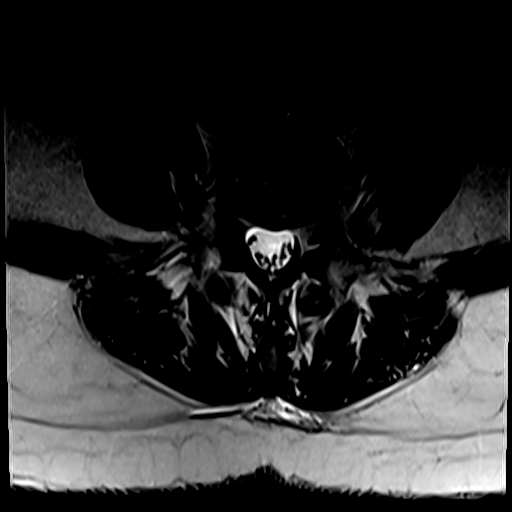
[im 17/40]
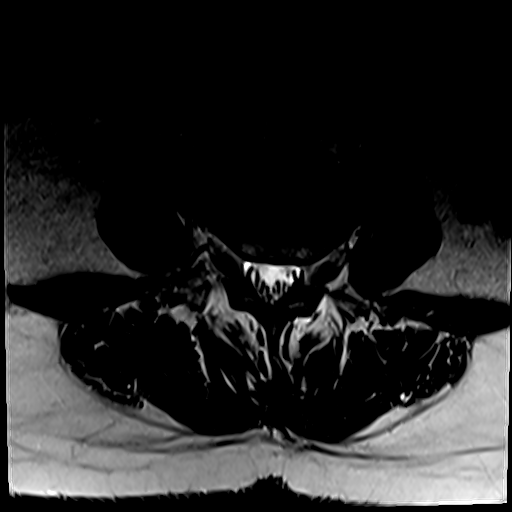
[im 20/40]
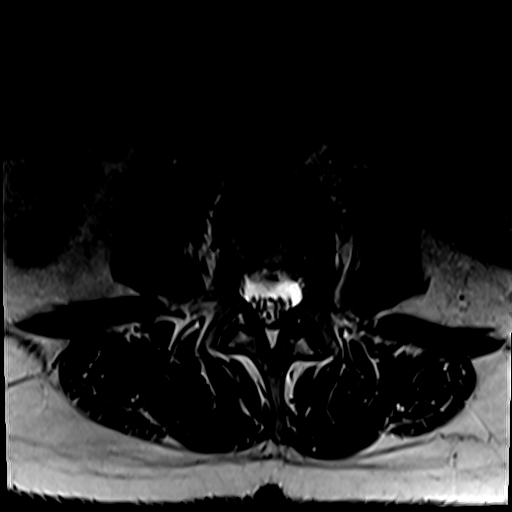
[im 23/40]
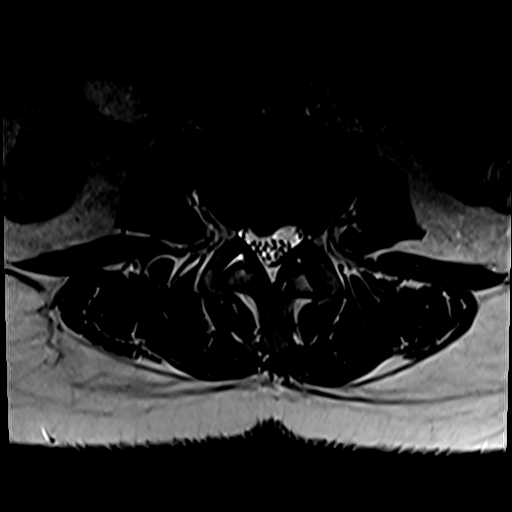
[im 28/40]
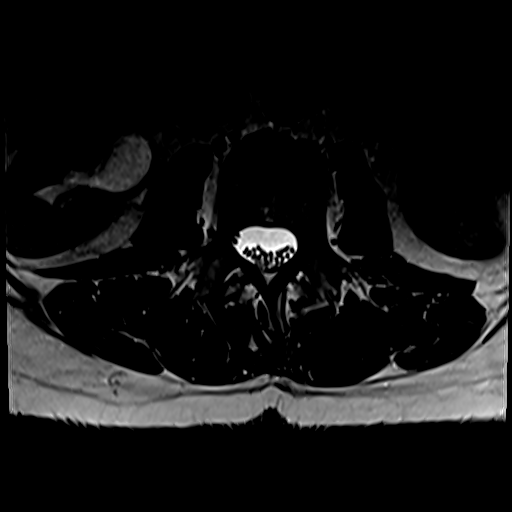
[im 34/40]
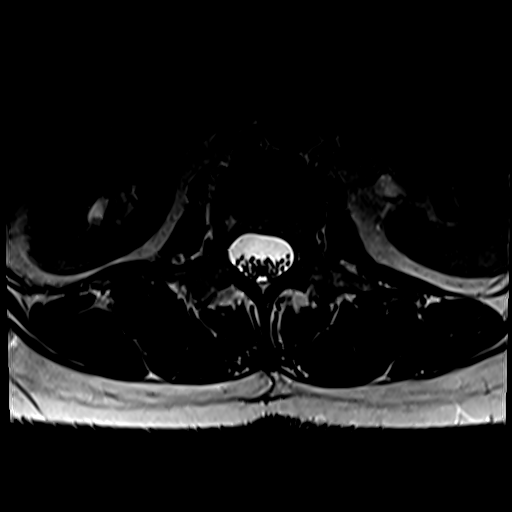
[im 40/40]
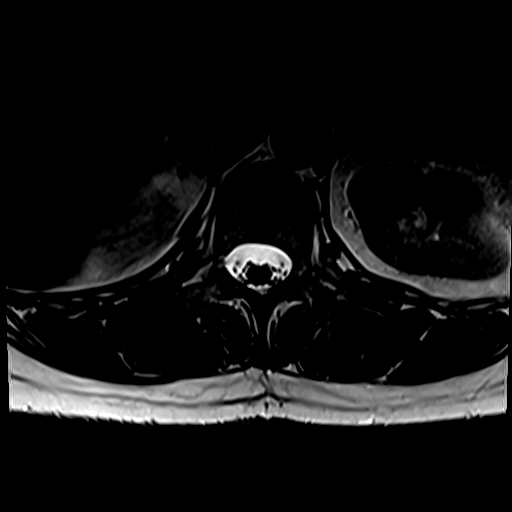

[24 of 48 positions shown; findings below may reference images not displayed]

FINDINGS: Segmentation:  Standard.

Alignment: Trace multilevel retrolisthesis is most notable at L3-4
where it measures approximately 0.3 cm.

Vertebrae:  No fracture, evidence of discitis, or bone lesion.

Conus medullaris and cauda equina: Conus extends to the L1-2 level.
Conus and cauda equina appear normal.

Paraspinal and other soft tissues: Negative.

Disc levels:

T11-12 is imaged in the sagittal plane only and negative.

T12-L1: Negative.

L1-2: Very shallow broad-based left paracentral protrusion without
stenosis.

L2-3: The patient has a right paracentral disc extrusion with slight
caudal extension. The disc indents the ventral thecal sac but the
central canal is open. There is mild narrowing in the right lateral
recess. Foramina are patent.

L3-4: There is a shallow disc bulge without central canal or
foraminal stenosis.

L4-5: Shallow disc bulge with endplate spur, ligamentum flavum
thickening and mild facet degenerative change. Mild narrowing is
seen in the subarticular recesses. The foramina remain open.

L5-S1: Shallow broad-based central protrusion without stenosis.
IMPRESSION: Down turning right paracentral disc extrusion at L2-3 causes mild
narrowing in the right subarticular recess. No nerve root
compression is identified.

Mild narrowing in the subarticular recesses at L4-5 where there is a
shallow disc bulge and some ligamentum flavum thickening.

## 2020-08-15 NOTE — Progress Notes (Signed)
HEMATOLOGY-ONCOLOGY MYCHART VIDEO VISIT PROGRESS NOTE  I connected with Kari Rowland on 08/16/2020 at  1:45 PM EST by MyChart video conference and verified that I am speaking with the correct person using two identifiers.  I discussed the limitations, risks, security and privacy concerns of performing an evaluation and management service by MyChart and the availability of in person appointments.  I also discussed with the patient that there may be a patient responsible charge related to this service. The patient expressed understanding and agreed to proceed.  Patient's Location: Home Physician Location: Clinic  CHIEF COMPLIANT: Follow-up of right breast cancer on anastrozole  INTERVAL HISTORY: Kari Rowland is a 60 y.o. female with above-mentioned history of right breast cancer who underwent a lumpectomy, radiation, and is currently on antiestrogen therapy with anastrozole. She presents over MyChart today for follow-up.  She had profound mood swings on anastrozole.  These ranged from feelings of sadness to severe anger and racing tempers very easily.  She stopped the medication and all of her symptoms went away.  She sold her house and [place] and moved to [place].  Oncology History  Malignant neoplasm of upper-inner quadrant of right breast in female, estrogen receptor positive ([place])  02/03/2020 Initial Diagnosis   Patient palpated a right beast lump for several months. Mammogram on 01/26/20 showed a 1.2cm mass in the right breast at the 1 o'clock position, a 0.4cm mass at the 12 o'clock position, and no evidence of abnormal axillary adenopathy. 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 She has a history of saline breast implants.   03/09/2020 Surgery   Right lumpectomy (Dr. Genia Hotter) : IDC 1.5 cm, grade 2, margins negative, 0/3 lymph nodes negative Second right lumpectomy: grade 2 IDC 7 mm, margins negative, ER  90%, PR 90%, HER-2 negative, Ki-67 20%   03/10/2020 Oncotype testing   Oncotype DX recurrence score 11, distant recurrence at 9 years: 3%, no chemotherapy benefit   04/23/2020 -  Radiation Therapy   Adjuvant radiation therapy   05/2020 -  Anti-estrogen oral therapy   Anastrozole, half tablet daily      Observations/Objective:  There were no vitals filed for this visit. There is no height or weight on file to calculate BMI.  I have reviewed the data as listed CMP Latest Ref Rng & Units 12/11/2009 08/14/2008 08/30/2007  Glucose 70 - 99 mg/dL 93 83 99  BUN 6 - 23 mg/dL 7 11 17   Creatinine 0.4 - 1.2 mg/dL 0.8 0.9 0.9  Sodium 135 - 145 meq/L 144 144 139  Potassium 3.5 - 5.1 meq/L 4.8 3.7 4.1  Chloride 96 - 112 meq/L 106 108 103  CO2 19 - 32 meq/L 31 31 29   Calcium 8.4 - 10.5 mg/dL 9.6 9.4 9.1  Total Protein 6.0 - 8.3 g/dL 6.6 6.3 5.7(L)  Total Bilirubin 0.3 - 1.2 mg/dL 0.5 0.9 0.7  Alkaline Phos 39 - 117 units/L 57 47 31(L)  AST 0 - 37 units/L 15 22 14   ALT 0 - 35 units/L 14 18 13     Lab Results  Component Value Date   WBC 5.4 08/14/2008   HGB 14.4 08/14/2008   HCT 41.1 08/14/2008   MCV 94.0 08/14/2008   PLT 198 08/14/2008   NEUTROABS 3.4 08/30/2007      Assessment Plan:  Malignant neoplasm of upper-inner quadrant of right breast in female, estrogen receptor positive ([place]) 02/03/2020:Patient palpated a right beast lump  for several months. Mammogram on 01/26/20 showed a 1.2cm mass in the right breast at the 1 o'clock position, a 0.4cm mass at the 12 o'clock position, and no evidence of abnormal axillary adenopathy. 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 She has a history of saline breast implants. Genetic testing: Negative  03/09/2020: Right lumpectomy (Dr.Chiba): IDC 1.5 cm, grade 2, margins negative, 0/3 lymph nodes negative, right second lumpectomy: Grade 2 IDC 7 mm, margins negative, ER 90%, PR  90%, HER-2 negative, Ki-67 20% Oncotype DX recurrence score 11, distant recurrence at 9 years: 3%, no chemotherapy benefit Second Oncotype has been ordered on the second tumor: Recurrence score 9: Distant recurrence at 9 years: 3%  Treatment summary: 1. Adjuvant radiation therapy 04/23/2020-05/22/2020 2. Adjuvant antiestrogen therapy (started 05/17/2020 at half a tablet daily) discontinued 06/17/2020  Anastrozole toxicities: Severe mood swings from sadness to severe anger, discontinued anastrozole I recommend switching her to letrozole. I sent a prescription for letrozole to her pharmacy.  She will start taking half a tablet daily.  Patient moved to [facility]. Return to clinic with a MyChart virtual visit in 1 month to assess tolerance to letrozole therapy.      I discussed the assessment and treatment plan with the patient. The patient was provided an opportunity to ask questions and all were answered. The patient agreed with the plan and demonstrated an understanding of the instructions. The patient was advised to call back or seek an in-person evaluation if the symptoms worsen or if the condition fails to improve as anticipated.   I provided 20 minutes of face-to-face MyChart video visit time during this encounter.    Rulon Eisenmenger, MD 08/16/2020   I, Molly Dorshimer, am acting as scribe for Nicholas Lose, MD.  I have reviewed the above documentation for accuracy and completeness, and I agree with the above.

## 2020-08-16 ENCOUNTER — Inpatient Hospital Stay
Payer: Federal, State, Local not specified - PPO | Attending: Hematology and Oncology | Admitting: Hematology and Oncology

## 2020-08-16 DIAGNOSIS — Z79811 Long term (current) use of aromatase inhibitors: Secondary | ICD-10-CM | POA: Insufficient documentation

## 2020-08-16 DIAGNOSIS — Z170 Estrogen receptor positive status [ER+]: Secondary | ICD-10-CM | POA: Diagnosis not present

## 2020-08-16 DIAGNOSIS — C50211 Malignant neoplasm of upper-inner quadrant of right female breast: Secondary | ICD-10-CM | POA: Insufficient documentation

## 2020-08-16 DIAGNOSIS — Z923 Personal history of irradiation: Secondary | ICD-10-CM | POA: Insufficient documentation

## 2020-08-16 MED ORDER — LETROZOLE 2.5 MG PO TABS: 2.5000 mg | ORAL_TABLET | Freq: Every day | ORAL | 0 refills | Status: DC

## 2020-08-16 NOTE — Assessment & Plan Note (Signed)
02/03/2020:Patient palpated a right beast lump for several months. Mammogram on 01/26/20 showed a 1.2cm mass in the right breast at the 1 o'clock position, a 0.4cm mass at the 12 o'clock position, and no evidence of abnormal axillary adenopathy. 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 She has a history of saline breast implants. Genetic testing: Negative  03/09/2020: Right lumpectomy (Dr.Chiba): IDC 1.5 cm, grade 2, margins negative, 0/3 lymph nodes negative, right second lumpectomy: Grade 2 IDC 7 mm, margins negative, ER 90%, PR 90%, HER-2 negative, Ki-67 20% Oncotype DX recurrence score 11, distant recurrence at 9 years: 3%, no chemotherapy benefit Second Oncotype has been ordered on the second tumor: Recurrence score 9: Distant recurrence at 9 years: 3%  Treatment summary: 1. Adjuvant radiation therapy 04/23/2020-05/22/2020 2. Adjuvant antiestrogen therapy (started 05/17/2020 at half a tablet daily  Anastrozole toxicities:  Patient is very anxious about the side effects of anastrozole therapy especially because she has family history of heart disease and she has high cholesterol.  Follow-up in 3 months for survivorship care plan visit

## 2020-08-20 ENCOUNTER — Other Ambulatory Visit: Payer: Self-pay | Admitting: Hematology and Oncology

## 2020-09-11 ENCOUNTER — Telehealth: Payer: Self-pay | Admitting: Adult Health

## 2020-09-11 NOTE — Telephone Encounter (Signed)
Rescheduled appt per 1/21 email/LC schedule change. Pt confirmed new appt date and time.

## 2020-09-11 NOTE — Telephone Encounter (Signed)
Rescheduled appts per 1/21 emial/LC schedule change. Left voicemail with appt cancellation and new appt date and time.

## 2020-09-13 ENCOUNTER — Other Ambulatory Visit: Payer: Self-pay | Admitting: Hematology and Oncology

## 2020-09-17 ENCOUNTER — Inpatient Hospital Stay: Payer: Federal, State, Local not specified - PPO | Admitting: Adult Health

## 2020-09-20 ENCOUNTER — Inpatient Hospital Stay: Payer: Federal, State, Local not specified - PPO | Attending: Hematology and Oncology | Admitting: Adult Health

## 2020-09-20 NOTE — Progress Notes (Deleted)
SURVIVORSHIP VIRTUAL VISIT:  I connected with Kari Rowland on 09/20/20 at 10:00 AM EST by my chart video and verified that I am speaking with the correct person using two identifiers.  I discussed the limitations, risks, security and privacy concerns of performing an evaluation and management service virtually and the availability of in person appointments. I also discussed with the patient that there may be a patient responsible charge related to this service. The patient expressed understanding and agreed to proceed.   Patient location Provider location: [facility] office Others participating in call:   BRIEF ONCOLOGIC HISTORY:  Oncology History  Malignant neoplasm of upper-inner quadrant of right breast in female, estrogen receptor positive ([place])  02/03/2020 Initial Diagnosis   Patient palpated a right beast lump for several months. Mammogram on 01/26/20 showed a 1.2cm mass in the right breast at the 1 o'clock position, a 0.4cm mass at the 12 o'clock position, and no evidence of abnormal axillary adenopathy. 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 She has a history of saline breast implants.   03/09/2020 Surgery   Right lumpectomy (Dr. Genia Hotter) : IDC 1.5 cm, grade 2, with DCIS. Margins negative. 3 lymph nodes negative for carcinoma. Second right lumpectomy: grade 2 IDC 7 mm, margins negative, ER 90%, PR 90%, HER-2 negative, Ki-67 20%   03/10/2020 Oncotype testing   Oncotype DX recurrence score 11, distant recurrence at 9 years: 3%, no chemotherapy benefit   04/23/2020 - 05/22/2020 Radiation Therapy   The patient initially received a dose of 42.56 Gy in 16 fractions to the breast using whole-breast tangent fields. This was delivered using a 3-D conformal technique. The pt received a boost delivering an additional 8 Gy in 2 fractions using a electron boost with 29mV electrons. The total dose was 50.56 Gy.   05/2020 -   Anti-estrogen oral therapy   Anastrozole from 05/17/2020 - 06/17/2020; discontinued due to toxicities.  She switched to Letrozole in 08/2020.     INTERVAL HISTORY:  Kari Rowland review her survivorship care plan detailing her treatment course for breast cancer, as well as monitoring long-term side effects of that treatment, education regarding health maintenance, screening, and overall wellness and health promotion.     Overall, Kari Rowland reports feeling quite well   REVIEW OF SYSTEMS:  Review of Systems  Constitutional: Negative for appetite change, chills, fatigue, fever and unexpected weight change.  HENT:   Negative for hearing loss, lump/mass and trouble swallowing.   Eyes: Negative for eye problems and icterus.  Respiratory: Negative for chest tightness, cough and shortness of breath.   Cardiovascular: Negative for chest pain, leg swelling and palpitations.  Gastrointestinal: Negative for abdominal distention, abdominal pain, constipation, diarrhea, nausea and vomiting.  Endocrine: Negative for hot flashes.  Genitourinary: Negative for difficulty urinating.   Musculoskeletal: Negative for arthralgias.  Skin: Negative for itching and rash.  Neurological: Negative for dizziness, extremity weakness, headaches and numbness.  Hematological: Negative for adenopathy. Does not bruise/bleed easily.  Psychiatric/Behavioral: Negative for depression. The patient is not nervous/anxious.    Breast: Denies any new nodularity, masses, tenderness, nipple changes, or nipple discharge.      ONCOLOGY TREATMENT TEAM:  1. Surgeon[facility]  [facility] 2. Medical Oncologist: Dr. GLindi Adie 3. Radiation Oncologist: Dr. SIsidore Moos   PAST MEDICAL/SURGICAL HISTORY:  Past Medical History:  Diagnosis Date  . Adjustment disorder with anxious mood 04/22/2012  . Allergy   .  AMENORRHEA, SECONDARY 09/20/2010   Qualifier: Diagnosis of  By: Esmeralda Arthur    . Atypical chest pain   .  Depression   . GERD (gastroesophageal reflux disease)   . Hep A w/o coma   . HYPERCHOLESTEROLEMIA 09/20/2010   Qualifier: Diagnosis of  By: Esmeralda Arthur    . MENOPAUSE-RELATED VASOMOTOR SYMPTOMS 12/11/2009   Qualifier: Diagnosis of  By: Regis Bill MD, Standley Brooking   . Phlebitis   . TOBACCO USE 08/20/2007   Qualifier: Diagnosis of  By: Regis Bill MD, Standley Brooking   . [place]    Past Surgical History:  Procedure Laterality Date  . ABDOMINAL HYSTERECTOMY    . BREAST LUMPECTOMY Right 03/01/2020  . BREAST SURGERY    . CERVICAL FUSION       ALLERGIES:  Allergies  Allergen Reactions  . Procaine Other (See Comments), Palpitations, Shortness Of Breath and Tinitus  . Sulfa Antibiotics Rash  . Azithromycin   . Erythromycin   . Morphine   . Other Nausea And Vomiting    PT CANNOT TAKE ANY OF THE MYCINS  . Rosuvastatin Nausea And Vomiting  . Sulfonamide Derivatives   . Tetracycline   . Ciprofloxacin Other (See Comments)     CURRENT MEDICATIONS:  Outpatient Encounter Medications as of 09/20/2020  Medication Sig Note  . atorvastatin (LIPITOR) 20 MG tablet Take 20 mg by mouth daily.   . cholecalciferol (VITAMIN D) 400 UNITS TABS Take 1,000 Units by mouth daily.   03/22/2019: 4,000 iu daily  . diazepam (VALIUM) 5 MG tablet 1/2 to 1 tab po tid prn vertigo (Patient not taking: Reported on 04/10/2020)   . letrozole (FEMARA) 2.5 MG tablet TAKE 1 TABLET BY MOUTH EVERY DAY   . omeprazole (PRILOSEC) 40 MG capsule Take 1 capsule (40 mg total) by mouth daily. (Patient not taking: Reported on 04/10/2020)   . silver sulfADIAZINE (SILVADENE) 1 % cream Apply 1 application topically daily.   . vitamin B-12 (CYANOCOBALAMIN) 1000 MCG tablet 1,000 mcg daily.    No facility-administered encounter medications on file as of 09/20/2020.     ONCOLOGIC FAMILY HISTORY:  Family History  Problem Relation Age of Onset  . Heart disease Other        CAD  . Thyroid disease Mother   . Hypertension Mother   . Scleroderma Other    . Diabetes Father      GENETIC COUNSELING/TESTING: Not at this time  SOCIAL HISTORY:  Social History   Socioeconomic History  . Marital status: Married    Spouse name: Not on file  . Number of children: Not on file  . Years of education: Not on file  . Highest education level: Not on file  Occupational History  . Not on file  Tobacco Use  . Smoking status: Current Every Day Smoker    Packs/day: 1.00    Years: 15.00    Pack years: 15.00    Types: Cigarettes  . Smokeless tobacco: Never Used  . Tobacco comment: Working on quitting  Substance and Sexual Activity  . Alcohol use: Yes    Comment: wine intermittently  . Drug use: No  . Sexual activity: Yes    Comment: adjudicator, RN [employer], single, engaged, household of 5 some caffeine.  Other Topics Concern  . Not on file  Social History Narrative  . Not on file   Social Determinants of Health   Financial Resource Strain: Low Risk   . Difficulty of Paying Living Expenses: Not hard at  all  Food Insecurity: No Food Insecurity  . Worried About Charity fundraiser in the Last Year: Never true  . Ran Out of Food in the Last Year: Never true  Transportation Needs: No Transportation Needs  . Lack of Transportation (Medical): No  . Lack of Transportation (Non-Medical): No  Physical Activity: Not on file  Stress: Not on file  Social Connections: Not on file  Intimate Partner Violence: Not on file     OBSERVATIONS/OBJECTIVE:   LABORATORY DATA:  None for this visit.  DIAGNOSTIC IMAGING:  None for this visit.      ASSESSMENT AND PLAN:  Kari Rowland is a pleasant 60 y.o. female with Stage IA right breast invasive ductal carcinoma, ER+/PR+/HER2-, diagnosed in 01/2020, treated with lumpectomy, adjuvant radiation therapy, and anti-estrogen therapy with Letrozole following difficulty with Anastrozole beginning in 05/2020.  She presents to the Survivorship Clinic for our initial meeting and routine follow-up  post-completion of treatment for breast cancer.    1. Stage IA right breast cancer:  Kari Rowland is continuing to recover from definitive treatment for breast cancer. She will follow-up with her medical oncologist, Dr. Ross Ludwig in *** with history and physical exam per surveillance protocol.  She will continue her anti-estrogen therapy with ***. Thus far, she is tolerating the *** well, with minimal side effects. She was instructed to make Dr. Lindi Adie or myself aware if she begins to experience any worsening side effects of the medication and I could see her back in clinic to help manage those side effects, as needed. Her mammogram is due ***; orders placed today.  Her breast density is category ***. Today, a comprehensive survivorship care plan and treatment summary was reviewed with the patient today detailing her breast cancer diagnosis, treatment course, potential late/long-term effects of treatment, appropriate follow-up care with recommendations for the future, and patient education resources.  A copy of this summary, along with a letter will be sent to the patient's primary care provider via mail/fax/In Basket message after today's visit.    #. Problem(s) at Visit______________  #. Bone health:  Given Kari Rowland's age/history of breast cancer and her current treatment regimen including anti-estrogen therapy with ***, she is at risk for bone demineralization.  Her last DEXA scan was ***, which showed ***.  In the meantime, she was encouraged to increase her consumption of foods rich in calcium, as well as increase her weight-bearing activities.  She was given education on specific activities to promote bone health.  #. Cancer screening:  Due to Kari Rowland's history and her age, she should receive screening for skin cancers, colon cancer, and gynecologic cancers.  The information and recommendations are listed on the patient's comprehensive care plan/treatment summary and were reviewed in  detail with the patient.    #. Health maintenance and wellness promotion: Kari Rowland was encouraged to consume 5-7 servings of fruits and vegetables per day. We reviewed the "Nutrition Rainbow" handout, as well as the handout "Take Control of Your Health and Reduce Your Cancer Risk" from the [employer].  She was also encouraged to engage in moderate to vigorous exercise for 30 minutes per day most days of the week. We discussed the [employer] fitness program, which is designed for cancer survivors to help them become more physically fit after cancer treatments.  She was instructed to limit her alcohol consumption and continue to abstain from tobacco use/***was encouraged stop smoking.     #. Support services/counseling: It is not uncommon  for this period of the patient's cancer care trajectory to be one of many emotions and stressors.  We discussed how this can be increasingly difficult during the times of quarantine and social distancing due to the COVID-19 pandemic.   She was given information regarding our available services and encouraged to contact me with any questions or for help enrolling in any of our support group/programs.    Follow up instructions:    -Return to cancer center ***  -Mammogram due in *** -Follow up with surgery *** -She is welcome to return back to the Survivorship Clinic at any time; no additional follow-up needed at this time.  -Consider referral back to survivorship as a long-term survivor for continued surveillance  The patient was provided an opportunity to ask questions and all were answered. The patient agreed with the plan and demonstrated an understanding of the instructions.   The patient was advised to call back or seek an in-person evaluation if the symptoms worsen or if the condition fails to improve as anticipated.   I provided *** minutes of {Blank single:19197::"face-to-face video visit time","non face-to-face telephone visit time"}  during this encounter, and > 50% was spent counseling as documented under my assessment & plan.   Wilber Bihari, NP 09/20/20 10:06 AM Medical Oncology and Hematology [facility] [place], [place] [address] Tel. [phone]    Fax. [phone]  *Total Encounter Time as defined by the Centers for Medicare and Medicaid Services includes, in addition to the face-to-face time of a patient visit (documented in the note above) non-face-to-face time: obtaining and reviewing outside history, ordering and reviewing medications, tests or procedures, care coordination (communications with other health care professionals or caregivers) and documentation in the medical record.

## 2020-09-24 NOTE — Assessment & Plan Note (Deleted)
02/03/2020:Patient palpated a right beast lump for several months. Mammogram on 01/26/20 showed a 1.2cm mass in the right breast at the 1 o'clock position, a 0.4cm mass at the 12 o'clock position, and no evidence of abnormal axillary adenopathy. 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 She has a history of saline breast implants. Genetic testing: Negative  03/09/2020: Right lumpectomy (Dr.Chiba): IDC 1.5 cm, grade 2, margins negative, 0/3 lymph nodes negative, right second lumpectomy: Grade 2 IDC 7 mm, margins negative, ER 90%, PR 90%, HER-2 negative, Ki-67 20% Oncotype DX recurrence score 11, distant recurrence at 9 years: 3%, no chemotherapy benefit Second Oncotype has been ordered on the second tumor: Recurrence score 9: Distant recurrence at 9 years: 3%  Treatment summary: 1. Adjuvant radiation therapy 04/23/2020-05/22/2020 2. Adjuvant antiestrogen therapy (started 05/17/2020 at half a tablet daily) discontinued 06/17/2020 ( Severe mood swings from sadness to severe anger) switched to Letrozole 08/16/20  Letrozole toxicities: (1/2 tab daily)  Patient moved to [facility]. Return to clinic with a MyChart virtual visit in 1 month to assess tolerance to letrozole therapy.

## 2020-09-24 NOTE — Progress Notes (Incomplete)
HEMATOLOGY-ONCOLOGY MYCHART VIDEO VISIT PROGRESS NOTE  I connected with Kari Rowland on 09/24/2020 at  2:00 PM EST by MyChart video conference and verified that I am speaking with the correct person using two identifiers.  I discussed the limitations, risks, security and privacy concerns of performing an evaluation and management service by MyChart and the availability of in person appointments.  I also discussed with the patient that there may be a patient responsible charge related to this service. The patient expressed understanding and agreed to proceed.  Patient's Location: Home Physician Location: Clinic  CHIEF COMPLIANT: Follow-up of right breast cancer to discuss antiestrogen therapy  INTERVAL HISTORY: Kari Rowland is a 60 y.o. female with above-mentioned history of right breast cancer who underwent a lumpectomy, radiation, and is currently on antiestrogen therapy with anastrozole. She presents today for follow-up.  Oncology History  Malignant neoplasm of upper-inner quadrant of right breast in female, estrogen receptor positive ([place])  02/03/2020 Initial Diagnosis   Patient palpated a right beast lump for several months. Mammogram on 01/26/20 showed a 1.2cm mass in the right breast at the 1 o'clock position, a 0.4cm mass at the 12 o'clock position, and no evidence of abnormal axillary adenopathy. 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 She has a history of saline breast implants.   03/09/2020 Surgery   Right lumpectomy (Dr. Genia Hotter) : IDC 1.5 cm, grade 2, with DCIS. Margins negative. 3 lymph nodes negative for carcinoma. Second right lumpectomy: grade 2 IDC 7 mm, margins negative, ER 90%, PR 90%, HER-2 negative, Ki-67 20%   03/10/2020 Oncotype testing   Oncotype DX recurrence score 11, distant recurrence at 9 years: 3%, no chemotherapy benefit   04/23/2020 - 05/22/2020 Radiation Therapy   The patient  initially received a dose of 42.56 Gy in 16 fractions to the breast using whole-breast tangent fields. This was delivered using a 3-D conformal technique. The pt received a boost delivering an additional 8 Gy in 2 fractions using a electron boost with 10mV electrons. The total dose was 50.56 Gy.   05/2020 -  Anti-estrogen oral therapy   Anastrozole from 05/17/2020 - 06/17/2020; discontinued due to toxicities.  She switched to Letrozole in 08/2020.     Observations/Objective:  There were no vitals filed for this visit. There is no height or weight on file to calculate BMI.  I have reviewed the data as listed CMP Latest Ref Rng & Units 12/11/2009 08/14/2008 08/30/2007  Glucose 70 - 99 mg/dL 93 83 99  BUN 6 - 23 mg/dL 7 11 17   Creatinine 0.4 - 1.2 mg/dL 0.8 0.9 0.9  Sodium 135 - 145 meq/L 144 144 139  Potassium 3.5 - 5.1 meq/L 4.8 3.7 4.1  Chloride 96 - 112 meq/L 106 108 103  CO2 19 - 32 meq/L 31 31 29   Calcium 8.4 - 10.5 mg/dL 9.6 9.4 9.1  Total Protein 6.0 - 8.3 g/dL 6.6 6.3 5.7(L)  Total Bilirubin 0.3 - 1.2 mg/dL 0.5 0.9 0.7  Alkaline Phos 39 - 117 units/L 57 47 31(L)  AST 0 - 37 units/L 15 22 14   ALT 0 - 35 units/L 14 18 13     Lab Results  Component Value Date   WBC 5.4 08/14/2008   HGB 14.4 08/14/2008   HCT 41.1 08/14/2008   MCV 94.0 08/14/2008   PLT 198 08/14/2008   NEUTROABS 3.4 08/30/2007      Assessment Plan:  No problem-specific  Assessment & Plan notes found for this encounter.    I discussed the assessment and treatment plan with the patient. The patient was provided an opportunity to ask questions and all were answered. The patient agreed with the plan and demonstrated an understanding of the instructions. The patient was advised to call back or seek an in-person evaluation if the symptoms worsen or if the condition fails to improve as anticipated.   Total time spent: *** minutes including face-to-face MyChart video visit time and time spent for planning, charting  and coordination of care  Rulon Eisenmenger, MD 09/24/2020   I, Cloyde Reams Dorshimer, am acting as scribe for Nicholas Lose, MD.  {insert scribe attestation}

## 2020-09-25 ENCOUNTER — Inpatient Hospital Stay: Payer: Federal, State, Local not specified - PPO | Admitting: Hematology and Oncology

## 2020-09-25 DIAGNOSIS — C50211 Malignant neoplasm of upper-inner quadrant of right female breast: Secondary | ICD-10-CM
